# Patient Record
Sex: Female | Born: 1990
Health system: Southern US, Community
[De-identification: ages and names within clinical notes are randomized; demographics above are authoritative.]

## PROBLEM LIST (undated history)

## (undated) DIAGNOSIS — I10 Essential (primary) hypertension: Secondary | ICD-10-CM

## (undated) DIAGNOSIS — Z789 Other specified health status: Secondary | ICD-10-CM

## (undated) HISTORY — PX: ANTERIOR CRUCIATE LIGAMENT REPAIR: SHX115

---

## 2011-11-06 ENCOUNTER — Ambulatory Visit: Payer: BC Managed Care – PPO

## 2012-07-25 ENCOUNTER — Encounter (HOSPITAL_COMMUNITY): Payer: Self-pay | Admitting: Emergency Medicine

## 2012-07-25 ENCOUNTER — Emergency Department (HOSPITAL_COMMUNITY)
Admission: EM | Admit: 2012-07-25 | Discharge: 2012-07-25 | Disposition: A | Discharge status: Home / Self Care | Payer: BC Managed Care – PPO | Source: Home / Self Care

## 2012-07-25 DIAGNOSIS — W57XXXA Bitten or stung by nonvenomous insect and other nonvenomous arthropods, initial encounter: Secondary | ICD-10-CM

## 2012-07-25 DIAGNOSIS — T148 Other injury of unspecified body region: Secondary | ICD-10-CM

## 2012-07-25 MED ORDER — HYDROXYZINE HCL 25 MG PO TABS: 25.0000 mg | ORAL_TABLET | Freq: Four times a day (QID) | ORAL | Status: DC

## 2012-07-25 MED ORDER — PERMETHRIN 5 % EX CREA: TOPICAL_CREAM | CUTANEOUS | Status: DC

## 2012-07-25 NOTE — ED Notes (Signed)
Patient says she stayed over her boyfriend house and find out that he had scabies.   She has the same bumps and marks on her body so she was using his cream.  Her boyfriend provider stated he had bed bugs.

## 2012-07-25 NOTE — ED Provider Notes (Signed)
   History    CSN: [id number] Arrival date & time 07/25/12  1810  None    Chief Complaint  Patient presents with  . scabies    (Consider location/radiation/quality/duration/timing/severity/associated sxs/prior Treatment) Patient is a 22 y.o. female presenting with rash. The history is provided by the patient.  Rash Duration:  1 day Timing:  Constant Progression:  Waxing and waning Context comment:  Boyfriend with rash last week, partially treated with permithrin, recurrent rash today, pruritic.  History reviewed. No pertinent past medical history. No past surgical history on file. No family history on file. History  Substance Use Topics  . Smoking status: Not on file  . Smokeless tobacco: Not on file  . Alcohol Use: Not on file   OB History   Grav Para Term Preterm Abortions TAB SAB Ect Mult Living                 Review of Systems  Constitutional: Negative.   HENT: Negative.   Gastrointestinal: Negative.   Skin: Positive for rash.    Allergies  Asa; Ibuprofen; and Nickel  Home Medications   Current Outpatient Rx  Name  Route  Sig  Dispense  Refill  . hydrOXYzine (ATARAX/VISTARIL) 25 MG tablet   Oral   Take 1 tablet (25 mg total) by mouth every 6 (six) hours.   20 tablet   0   . permethrin (ELIMITE) 5 % cream      Use as directed on bottle, repeat in 1 week.   60 g   1    BP 134/71  Pulse 66  Temp(Src) 98.6 F (37 C) (Oral)  Resp 16  SpO2 100% Physical Exam  Nursing note and vitals reviewed. Constitutional: She is oriented to person, place, and time. She appears well-developed and well-nourished.  Neurological: She is alert and oriented to person, place, and time.  Skin: Skin is warm and dry. Rash noted.  Papules, erythematous scattered over body pruritic, c/w insect bites.    ED Course  Procedures (including critical care time) Labs Reviewed - No data to display No results found. 1. Multiple insect bites     MDM    Linna Hoff,  MD 07/25/12 1904

## 2013-03-15 ENCOUNTER — Encounter (HOSPITAL_COMMUNITY): Payer: Self-pay | Admitting: Emergency Medicine

## 2013-03-15 ENCOUNTER — Emergency Department (INDEPENDENT_AMBULATORY_CARE_PROVIDER_SITE_OTHER): Payer: Self-pay

## 2013-03-15 ENCOUNTER — Emergency Department (INDEPENDENT_AMBULATORY_CARE_PROVIDER_SITE_OTHER)
Admission: EM | Admit: 2013-03-15 | Discharge: 2013-03-15 | Disposition: A | Discharge status: Home / Self Care | Payer: Self-pay | Source: Home / Self Care | Attending: Family Medicine | Admitting: Family Medicine

## 2013-03-15 DIAGNOSIS — M542 Cervicalgia: Secondary | ICD-10-CM

## 2013-03-15 DIAGNOSIS — S20219A Contusion of unspecified front wall of thorax, initial encounter: Secondary | ICD-10-CM

## 2013-03-15 MED ORDER — PREDNISONE 10 MG PO KIT: PACK | ORAL | Status: DC

## 2013-03-15 MED ORDER — TRAMADOL HCL 50 MG PO TABS: 50.0000 mg | ORAL_TABLET | Freq: Four times a day (QID) | ORAL | Status: DC | PRN

## 2013-03-15 MED ORDER — CYCLOBENZAPRINE HCL 5 MG PO TABS: 5.0000 mg | ORAL_TABLET | Freq: Every evening | ORAL | Status: DC | PRN

## 2013-03-15 NOTE — Discharge Instructions (Signed)
Thank you for coming in today. Physical therapy should contact you in a few days.  Take medications as directed and as needed If not getting better followup with Dr. Tamala Julian at [facility] or go to the emergency room if you notice new weakness new numbness problems walking or bowel or bladder problems.  Cervical Sprain A cervical sprain is an injury in the neck in which the strong, fibrous tissues (ligaments) that connect your neck bones stretch or tear. Cervical sprains can range from mild to severe. Severe cervical sprains can cause the neck vertebrae to be unstable. This can lead to damage of the spinal cord and can result in serious nervous system problems. The amount of time it takes for a cervical sprain to get better depends on the cause and extent of the injury. Most cervical sprains heal in 1 to 3 weeks. CAUSES  Severe cervical sprains may be caused by:   Contact sport injuries (such as from football, rugby, wrestling, hockey, auto racing, gymnastics, diving, martial arts, or boxing).   Motor vehicle collisions.   Whiplash injuries. This is an injury from a sudden forward-and backward whipping movement of the head and neck.  Falls.  Mild cervical sprains may be caused by:   Being in an awkward position, such as while cradling a telephone between your ear and shoulder.   Sitting in a chair that does not offer proper support.   Working at a poorly Landscape architect station.   Looking up or down for long periods of time.  SYMPTOMS   Pain, soreness, stiffness, or a burning sensation in the front, back, or sides of the neck. This discomfort may develop immediately after the injury or slowly, 24 hours or more after the injury.   Pain or tenderness directly in the middle of the back of the neck.   Shoulder or upper back pain.   Limited ability to move the neck.   Headache.   Dizziness.   Weakness, numbness, or tingling in the hands or arms.    Muscle spasms.   Difficulty swallowing or chewing.   Tenderness and swelling of the neck.  DIAGNOSIS  Most of the time your health care provider can diagnose a cervical sprain by taking your history and doing a physical exam. Your health care provider will ask about previous neck injuries and any known neck problems, such as arthritis in the neck. X-rays may be taken to find out if there are any other problems, such as with the bones of the neck. Other tests, such as a CT scan or MRI, may also be needed.  TREATMENT  Treatment depends on the severity of the cervical sprain. Mild sprains can be treated with rest, keeping the neck in place (immobilization), and pain medicines. Severe cervical sprains are immediately immobilized. Further treatment is done to help with pain, muscle spasms, and other symptoms and may include:  Medicines, such as pain relievers, numbing medicines, or muscle relaxants.   Physical therapy. This may involve stretching exercises, strengthening exercises, and posture training. Exercises and improved posture can help stabilize the neck, strengthen muscles, and help stop symptoms from returning.  HOME CARE INSTRUCTIONS   Put ice on the injured area.   Put ice in a plastic bag.   Place a towel between your skin and the bag.   Leave the ice on for 15 20 minutes, 3 4 times a day.   If your injury was severe, you may have been given a cervical  collar to wear. A cervical collar is a two-piece collar designed to keep your neck from moving while it heals.  Do not remove the collar unless instructed by your health care provider.  If you have long hair, keep it outside of the collar.  Ask your health care provider before making any adjustments to your collar. Minor adjustments may be required over time to improve comfort and reduce pressure on your chin or on the back of your head.  Ifyou are allowed to remove the collar for cleaning or bathing, follow your  health care provider's instructions on how to do so safely.  Keep your collar clean by wiping it with mild soap and water and drying it completely. If the collar you have been given includes removable pads, remove them every 1 2 days and hand wash them with soap and water. Allow them to air dry. They should be completely dry before you wear them in the collar.  If you are allowed to remove the collar for cleaning and bathing, wash and dry the skin of your neck. Check your skin for irritation or sores. If you see any, tell your health care provider.  Do not drive while wearing the collar.   Only take over-the-counter or prescription medicines for pain, discomfort, or fever as directed by your health care provider.   Keep all follow-up appointments as directed by your health care provider.   Keep all physical therapy appointments as directed by your health care provider.   Make any needed adjustments to your workstation to promote good posture.   Avoid positions and activities that make your symptoms worse.   Warm up and stretch before being active to help prevent problems.  SEEK MEDICAL CARE IF:   Your pain is not controlled with medicine.   You are unable to decrease your pain medicine over time as planned.   Your activity level is not improving as expected.  SEEK IMMEDIATE MEDICAL CARE IF:   You develop any bleeding.  You develop stomach upset.  You have signs of an allergic reaction to your medicine.   Your symptoms get worse.   You develop new, unexplained symptoms.   You have numbness, tingling, weakness, or paralysis in any part of your body.  MAKE SURE YOU:   Understand these instructions.  Will watch your condition.  Will get help right away if you are not doing well or get worse. Document Released: 11/02/2006 Document Revised: 10/26/2012 Document Reviewed: 07/13/2012 [facility] Patient Information 2014 [place].  Blunt Chest Trauma Blunt  chest trauma is an injury caused by a blow to the chest. These chest injuries can be very painful. Blunt chest trauma often results in bruised or broken (fractured) ribs. Most cases of bruised and fractured ribs from blunt chest traumas get better after 1 to 3 weeks of rest and pain medicine. Often, the soft tissue in the chest wall is also injured, causing pain and bruising. Internal organs, such as the heart and lungs, may also be injured. Blunt chest trauma can lead to serious medical problems. This injury requires immediate medical care. CAUSES   Motor vehicle collisions.  Falls.  Physical violence.  Sports injuries. SYMPTOMS   Chest pain. The pain may be worse when you move or breathe deeply.  Shortness of breath.  Lightheadedness.  Bruising.  Tenderness.  Swelling. DIAGNOSIS  Your caregiver will do a physical exam. X-rays may be taken to look for fractures. However, minor rib fractures may not show up on  X-rays until a few days after the injury. If a more serious injury is suspected, further imaging tests may be done. This may include ultrasounds, computed tomography (CT) scans, or magnetic resonance imaging (MRI). TREATMENT  Treatment depends on the severity of your injury. Your caregiver may prescribe pain medicines and deep breathing exercises. HOME CARE INSTRUCTIONS  Limit your activities until you can move around without much pain.  Do not do any strenuous work until your injury is healed.  Put ice on the injured area.  Put ice in a plastic bag.  Place a towel between your skin and the bag.  Leave the ice on for 15-20 minutes, 03-04 times a day.  You may wear a rib belt as directed by your caregiver to reduce pain.  Practice deep breathing as directed by your caregiver to keep your lungs clear.  Only take over-the-counter or prescription medicines for pain, fever, or discomfort as directed by your caregiver. SEEK IMMEDIATE MEDICAL CARE IF:   You have  increasing pain or shortness of breath.  You cough up blood.  You have nausea, vomiting, or abdominal pain.  You have a fever.  You feel dizzy, weak, or you faint. MAKE SURE YOU:  Understand these instructions.  Will watch your condition.  Will get help right away if you are not doing well or get worse. Document Released: 02/13/2004 Document Revised: 03/30/2011 Document Reviewed: 10/22/2010 [facility] Patient Information 2014 [place].
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
## 2016-03-05 DIAGNOSIS — R110 Nausea: Secondary | ICD-10-CM | POA: Diagnosis not present

## 2016-03-05 DIAGNOSIS — M545 Low back pain: Secondary | ICD-10-CM | POA: Diagnosis not present

## 2016-05-15 DIAGNOSIS — N62 Hypertrophy of breast: Secondary | ICD-10-CM | POA: Diagnosis not present

## 2016-05-18 ENCOUNTER — Other Ambulatory Visit: Payer: Self-pay | Admitting: Family Medicine

## 2016-05-18 DIAGNOSIS — R109 Unspecified abdominal pain: Secondary | ICD-10-CM

## 2016-05-26 ENCOUNTER — Ambulatory Visit
Admission: RE | Admit: 2016-05-26 | Discharge: 2016-05-26 | Disposition: A | Discharge status: Home / Self Care | Payer: 59 | Source: Ambulatory Visit | Attending: Family Medicine | Admitting: Family Medicine

## 2016-05-26 DIAGNOSIS — R109 Unspecified abdominal pain: Secondary | ICD-10-CM | POA: Diagnosis not present

## 2016-06-08 DIAGNOSIS — N62 Hypertrophy of breast: Secondary | ICD-10-CM | POA: Diagnosis not present

## 2016-06-30 DIAGNOSIS — N760 Acute vaginitis: Secondary | ICD-10-CM | POA: Diagnosis not present

## 2017-06-11 DIAGNOSIS — M25561 Pain in right knee: Secondary | ICD-10-CM | POA: Diagnosis not present

## 2017-06-11 DIAGNOSIS — G8929 Other chronic pain: Secondary | ICD-10-CM | POA: Diagnosis not present

## 2017-06-16 DIAGNOSIS — S83511D Sprain of anterior cruciate ligament of right knee, subsequent encounter: Secondary | ICD-10-CM | POA: Diagnosis not present

## 2017-06-16 DIAGNOSIS — M25561 Pain in right knee: Secondary | ICD-10-CM | POA: Diagnosis not present

## 2017-06-16 DIAGNOSIS — M6281 Muscle weakness (generalized): Secondary | ICD-10-CM | POA: Diagnosis not present

## 2017-06-25 DIAGNOSIS — M6281 Muscle weakness (generalized): Secondary | ICD-10-CM | POA: Diagnosis not present

## 2017-06-25 DIAGNOSIS — M25561 Pain in right knee: Secondary | ICD-10-CM | POA: Diagnosis not present

## 2017-06-25 DIAGNOSIS — S83511D Sprain of anterior cruciate ligament of right knee, subsequent encounter: Secondary | ICD-10-CM | POA: Diagnosis not present

## 2017-06-28 DIAGNOSIS — S83511D Sprain of anterior cruciate ligament of right knee, subsequent encounter: Secondary | ICD-10-CM | POA: Diagnosis not present

## 2017-06-28 DIAGNOSIS — M6281 Muscle weakness (generalized): Secondary | ICD-10-CM | POA: Diagnosis not present

## 2017-06-28 DIAGNOSIS — M25561 Pain in right knee: Secondary | ICD-10-CM | POA: Diagnosis not present

## 2018-01-30 IMAGING — US US ABDOMEN COMPLETE
1 series · 14 of 25 positions shown · non-contrast
Comparison: None.

CLINICAL DATA: Diffuse abdominal pain for the past 5 months.

EXAM:
ABDOMEN ULTRASOUND COMPLETE

[Series 1: us abdomen complete · 0.17mm/px · 14 of 84 slices shown]
[im 1/84]
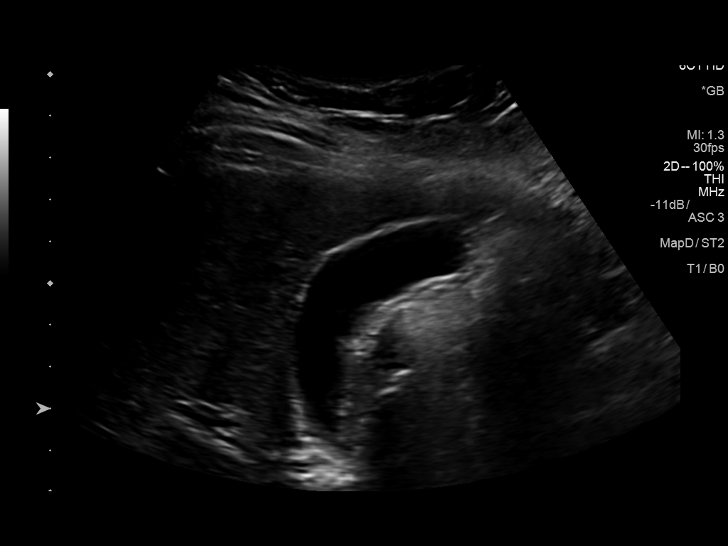
[im 7/84]
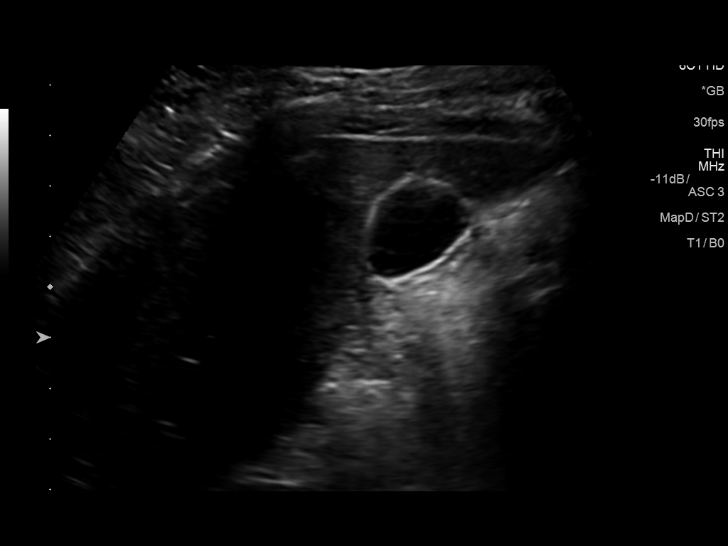
[im 14/84]
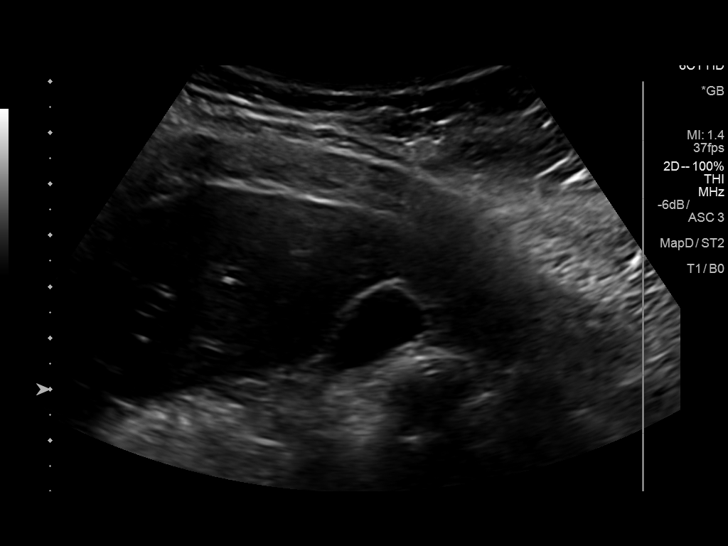
[im 21/84]
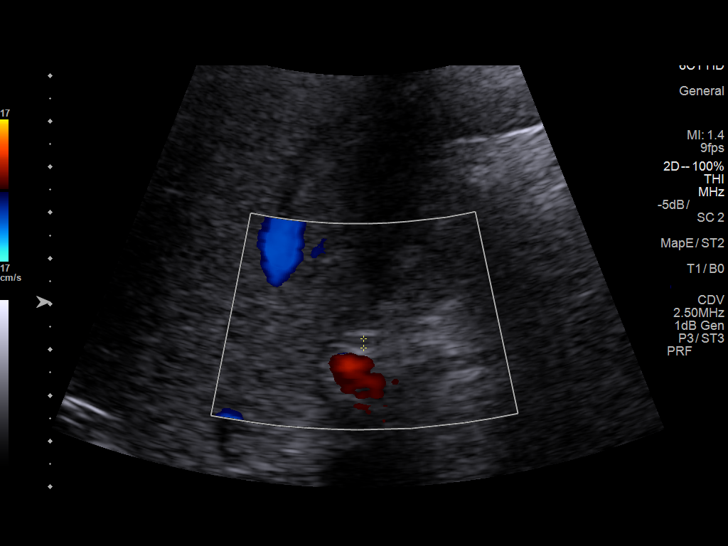
[im 28/84]
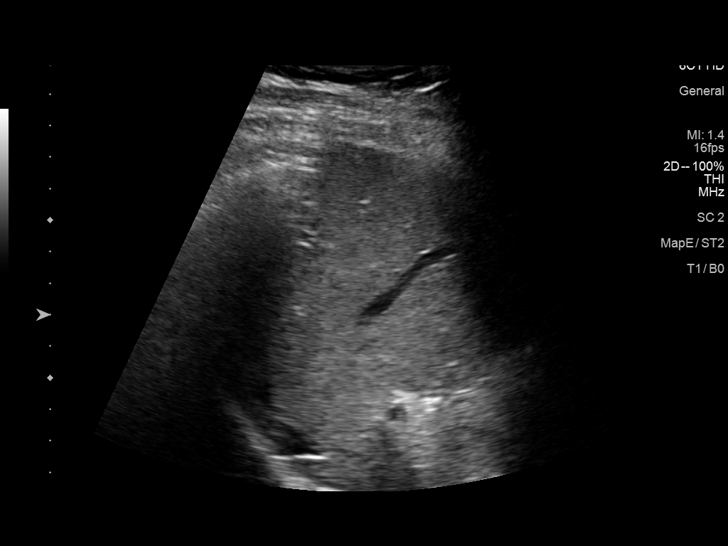
[im 32/84]
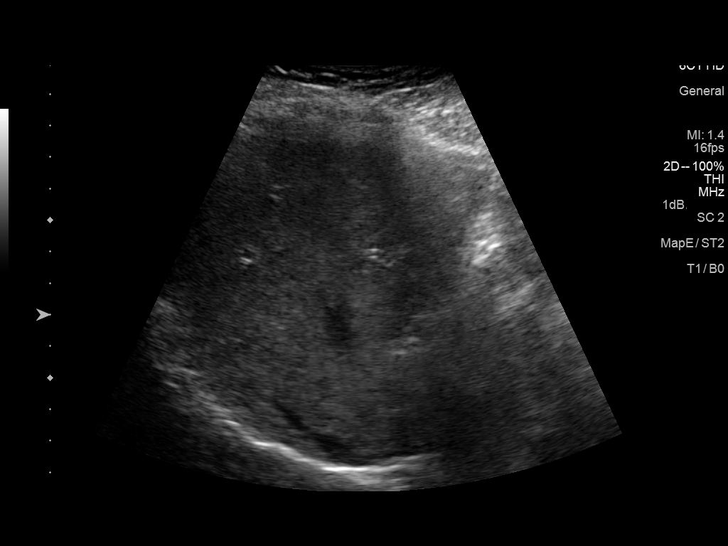
[im 39/84]
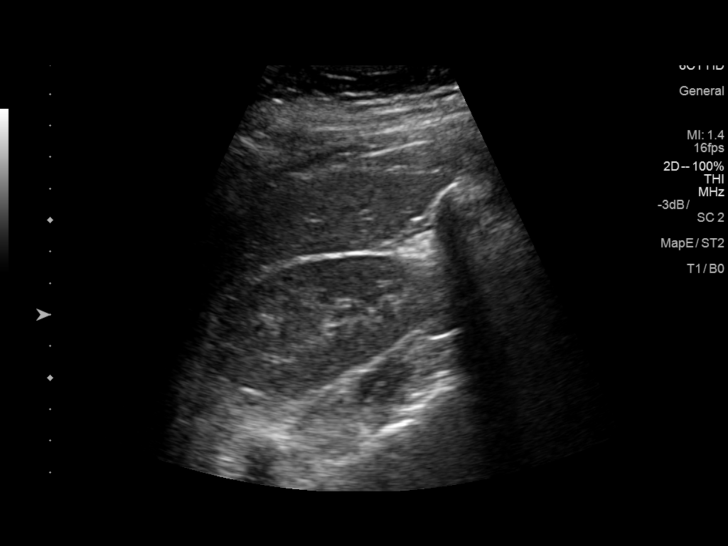
[im 45/84]
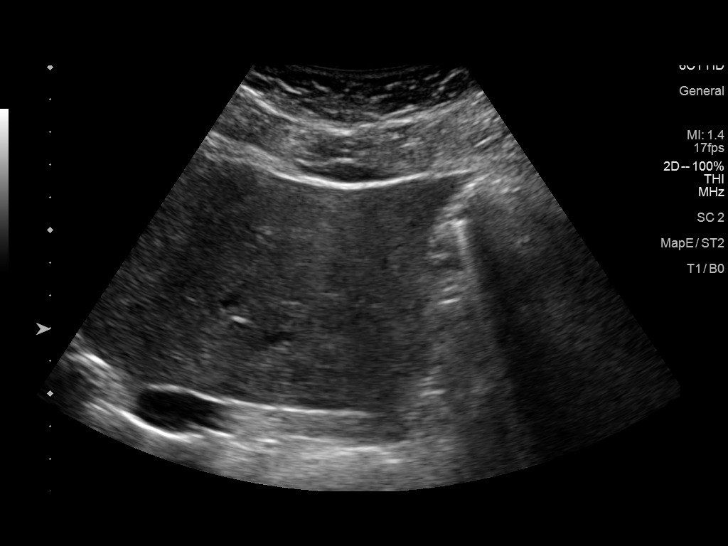
[im 52/84]
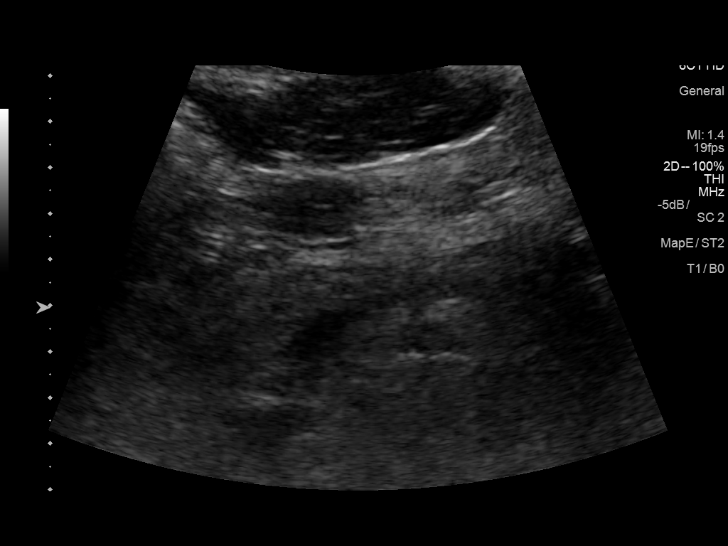
[im 56/84]
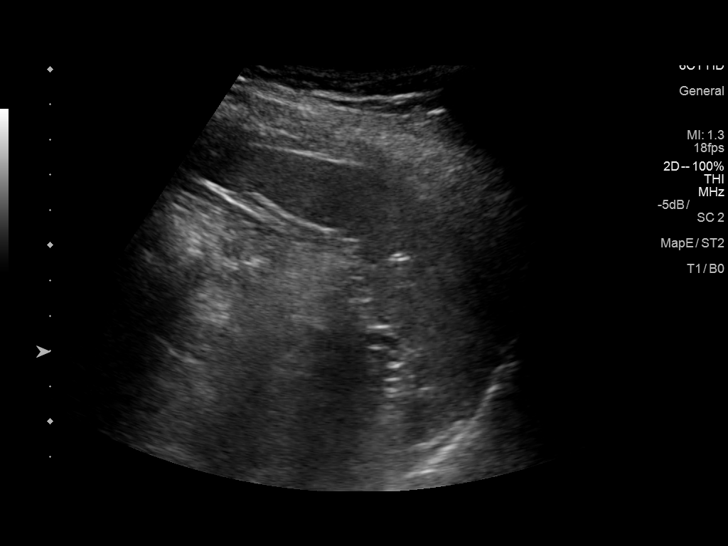
[im 63/84]
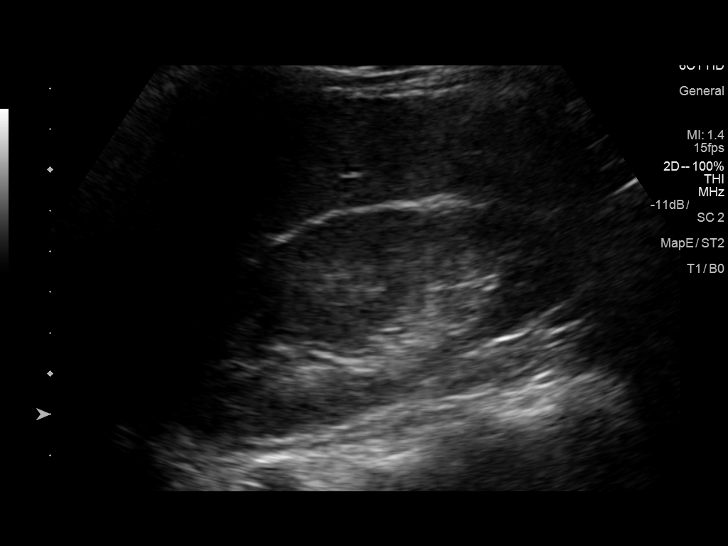
[im 70/84]
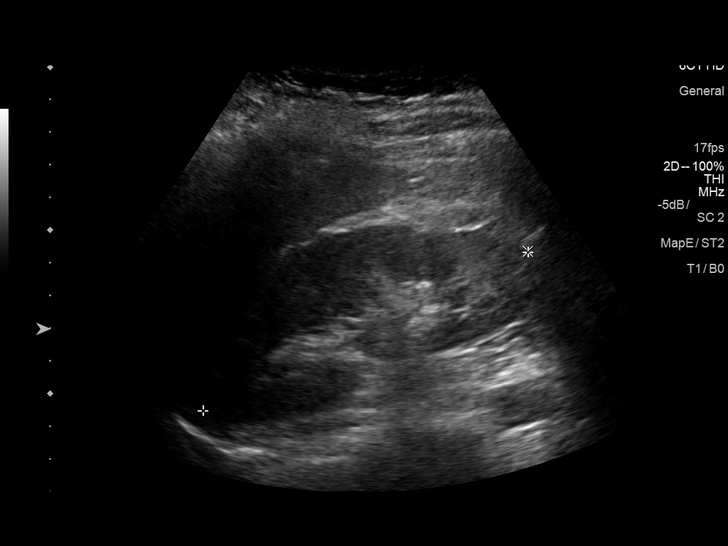
[im 77/84]
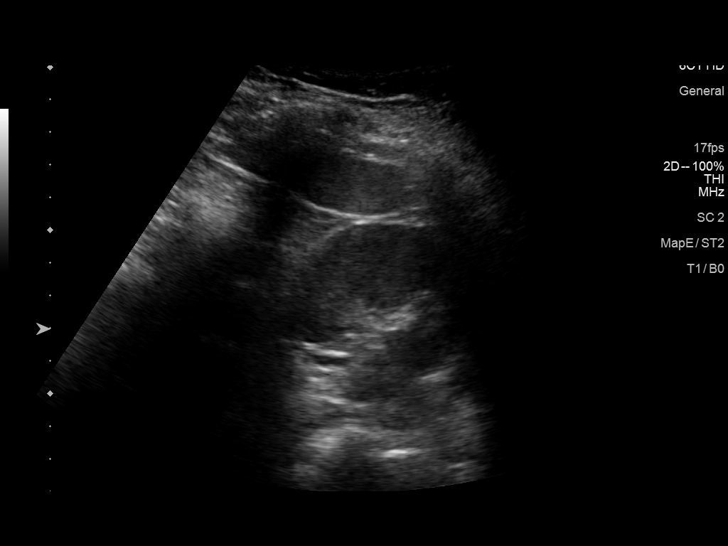
[im 84/84]
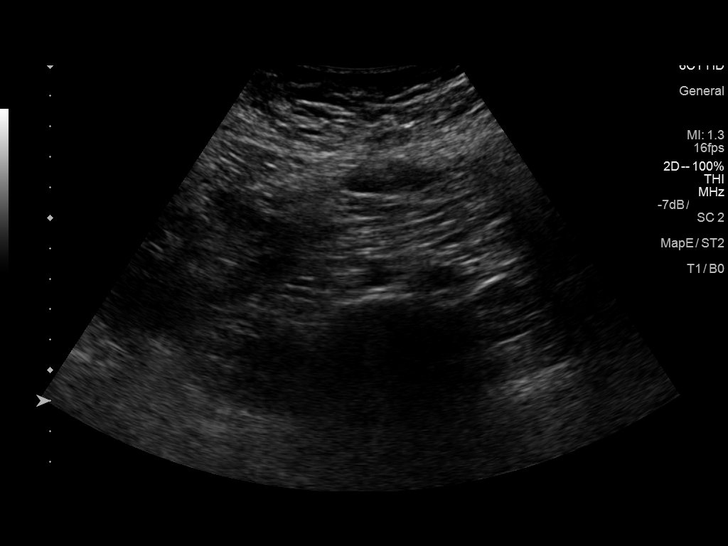

[14 of 25 positions shown; findings below may reference images not displayed]

FINDINGS: Gallbladder: No gallstones or wall thickening visualized. No
sonographic Murphy sign noted by sonographer.

Common bile duct: Diameter: 2.0 mm

Liver: No focal lesion identified. Within normal limits in
parenchymal echogenicity.

IVC: No abnormality visualized.

Pancreas: Visualized portion unremarkable.

Spleen: Size and appearance within normal limits.

Right Kidney: Length: 11.5 cm. Echogenicity within normal limits. No
mass or hydronephrosis visualized.

Left Kidney: Length: 10.9 cm. Echogenicity within normal limits. No
mass or hydronephrosis visualized.

Abdominal aorta: No aneurysm visualized.

Other findings: None.
IMPRESSION: Normal examination.

## 2018-08-29 ENCOUNTER — Encounter (HOSPITAL_COMMUNITY): Payer: Self-pay

## 2018-08-29 ENCOUNTER — Emergency Department (HOSPITAL_COMMUNITY)
Admission: EM | Admit: 2018-08-29 | Discharge: 2018-08-29 | Discharge status: Against Medical Advice | Payer: 59 | Attending: Emergency Medicine | Admitting: Emergency Medicine

## 2018-08-29 DIAGNOSIS — R509 Fever, unspecified: Secondary | ICD-10-CM | POA: Diagnosis present

## 2018-08-29 DIAGNOSIS — R5383 Other fatigue: Secondary | ICD-10-CM | POA: Insufficient documentation

## 2018-08-29 DIAGNOSIS — K5900 Constipation, unspecified: Secondary | ICD-10-CM | POA: Diagnosis not present

## 2018-08-29 DIAGNOSIS — Z5321 Procedure and treatment not carried out due to patient leaving prior to being seen by health care provider: Secondary | ICD-10-CM | POA: Diagnosis not present

## 2018-08-29 NOTE — ED Notes (Signed)
Pt stated she was leaving and seen walking towards the parking lot

## 2018-08-29 NOTE — ED Triage Notes (Signed)
Pt states that she has been having a fever since Friday with chills, fatigue and constipation and wants to be tested for covid

## 2020-01-20 NOTE — L&D Delivery Note (Signed)
Delivery Note [facility] physician pt    Patient Name: Kathryn Shepherd [facility] DOB: September 14, 1990 MRN: [id number]  Date of admission: 09/13/2020 Delivering MD: Noralyn Pick  Date of delivery: 09/14/20 Type of delivery: SVD  Newborn Data: Live born female  Birth Weight:   APGAR: 34, 11   Newborn Delivery   Birth date/time: 09/14/2020 18:49:00 Delivery type: Vaginal, Spontaneous      Yanina Trenise Shepherd, 30 y.o., @ [redacted]w[redacted]d  G1P0, who was admitted for G1P0, with an IUP @ 37w3dpresented for PROM at 2300 on 8/25. Received care with Dr CoLandry Mellowith [name]. Obesity BMI 41. Fibroids. New onset GHTN, PCR 0.08, other labs unremarkable, asymptomatic.. Marland Kitchent was PPROM about 40 hours, no abx indicated, pt afebrile, no tachycardia on fetal or maternal side.  I was called to the room when she progressed 2+ station in the second stage of labor.  She pushed for 15/min.  Turtle sing was noted with delivery of fetal, head, tight hymnal ring noted as well. Fetal head was delivered was unable to deliver the shoulder with gentle traction, I called shoulder dystocia, pt was placed down, and McRoberts with suprapubic was administered, I was able to deliver the posterior shoulder with ease after 10 seconds, no residual effect, full and equal strength with both arms, strong grip, discussed risk of shoulder dystocia with parents, denies questions. She delivered a viable infant, cephalic and restituted to the LOA position over an intact perineum.  A nuchal cord   was not identified. The baby was placed on maternal abdomen while initial step of NRP were perfmored (Dry, Stimulated, and warmed). Hat placed on baby for thermoregulation. Delayed cord clamping was performed for 2 minutes.  Cord double clamped and cut.  Cord cut by father. Apgar scores were 7 and 9. Prophylactic Pitocin was started in the third stage of labor for active management. The placenta delivered spontaneously, shultz, with a 3 vessel cord and was sent to LD.  Inspection  revealed none. An examination of the vaginal vault and cervix was free from lacerations. The uterus was firm, bleeding stable.   Placenta and umbilical artery blood gas were not sent.  There were no complications during the procedure.  Mom and baby skin to skin following delivery. Left in stable condition. BP (!) 133/92   Pulse 78   Temp (!) 97.4 F (36.3 C) (Oral)   Resp 17   Ht '5\' 8"'$  (1.727 m)   Wt 123.4 kg   SpO2 100%   BMI 41.36 kg/m  Pt denies HA, RUQ pain or vision changes. Plan to repeat CBC and CMP in the morning.   Maternal Info: Anesthesia: Epidural Episiotomy: no Lacerations:  no Suture Repair: no Est. Blood Loss (mL):  3535m Newborn Info:  Baby Sex: female Circumcision: in pt circ desired Babies Name: Kathryn Shepherd APGAR (1 MIN):  7 APGAR (5 MINS):  9 APGAR (10 MINS):     Mom to postpartum.  Baby to Couplet care / Skin to Skin.  DR Dillard updated  JadWashingtonNMLa PalmaP-C 09/14/20 7:45 PM

## 2020-09-13 ENCOUNTER — Encounter (HOSPITAL_COMMUNITY): Payer: Self-pay | Admitting: Obstetrics and Gynecology

## 2020-09-13 ENCOUNTER — Other Ambulatory Visit: Payer: Self-pay

## 2020-09-13 ENCOUNTER — Inpatient Hospital Stay (HOSPITAL_COMMUNITY)
Admission: AD | Admit: 2020-09-13 | Discharge: 2020-09-16 | DRG: 806 | Disposition: A | Discharge status: Home / Self Care | Payer: 59 | Attending: Obstetrics and Gynecology | Admitting: Obstetrics and Gynecology

## 2020-09-13 ENCOUNTER — Inpatient Hospital Stay (HOSPITAL_COMMUNITY): Payer: 59 | Admitting: Anesthesiology

## 2020-09-13 DIAGNOSIS — O3413 Maternal care for benign tumor of corpus uteri, third trimester: Secondary | ICD-10-CM | POA: Diagnosis present

## 2020-09-13 DIAGNOSIS — Z3A37 37 weeks gestation of pregnancy: Secondary | ICD-10-CM | POA: Diagnosis not present

## 2020-09-13 DIAGNOSIS — O99214 Obesity complicating childbirth: Secondary | ICD-10-CM | POA: Diagnosis present

## 2020-09-13 DIAGNOSIS — Z20822 Contact with and (suspected) exposure to covid-19: Secondary | ICD-10-CM | POA: Diagnosis present

## 2020-09-13 DIAGNOSIS — O134 Gestational [pregnancy-induced] hypertension without significant proteinuria, complicating childbirth: Secondary | ICD-10-CM | POA: Diagnosis present

## 2020-09-13 DIAGNOSIS — O4292 Full-term premature rupture of membranes, unspecified as to length of time between rupture and onset of labor: Secondary | ICD-10-CM | POA: Diagnosis present

## 2020-09-13 DIAGNOSIS — O26893 Other specified pregnancy related conditions, third trimester: Secondary | ICD-10-CM | POA: Diagnosis present

## 2020-09-13 DIAGNOSIS — O139 Gestational [pregnancy-induced] hypertension without significant proteinuria, unspecified trimester: Secondary | ICD-10-CM | POA: Diagnosis present

## 2020-09-13 DIAGNOSIS — D62 Acute posthemorrhagic anemia: Secondary | ICD-10-CM | POA: Diagnosis not present

## 2020-09-13 DIAGNOSIS — O9081 Anemia of the puerperium: Secondary | ICD-10-CM | POA: Diagnosis not present

## 2020-09-13 DIAGNOSIS — D259 Leiomyoma of uterus, unspecified: Secondary | ICD-10-CM | POA: Diagnosis present

## 2020-09-13 DIAGNOSIS — O4290 Premature rupture of membranes, unspecified as to length of time between rupture and onset of labor, unspecified weeks of gestation: Secondary | ICD-10-CM | POA: Diagnosis present

## 2020-09-13 HISTORY — DX: Other specified health status: Z78.9

## 2020-09-13 LAB — CBC
HCT: 34.4 % — ABNORMAL LOW (ref 36.0–46.0)
HCT: 34.3 % — ABNORMAL LOW (ref 36.0–46.0)
Hemoglobin: 11.2 g/dL — ABNORMAL LOW (ref 12.0–15.0)
Hemoglobin: 11.2 g/dL — ABNORMAL LOW (ref 12.0–15.0)
MCH: 26.2 pg (ref 26.0–34.0)
MCH: 26.1 pg (ref 26.0–34.0)
MCHC: 32.6 g/dL (ref 30.0–36.0)
MCHC: 32.7 g/dL (ref 30.0–36.0)
MCV: 80.6 fL (ref 80.0–100.0)
MCV: 80 fL (ref 80.0–100.0)
Platelets: 277 10*3/uL (ref 150–400)
Platelets: 254 10*3/uL (ref 150–400)
RBC: 4.27 MIL/uL (ref 3.87–5.11)
RBC: 4.29 MIL/uL (ref 3.87–5.11)
RDW: 13.1 % (ref 11.5–15.5)
RDW: 13 % (ref 11.5–15.5)
WBC: 10 10*3/uL (ref 4.0–10.5)
WBC: 10.2 10*3/uL (ref 4.0–10.5)
nRBC: 0 % (ref 0.0–0.2)
nRBC: 0 % (ref 0.0–0.2)

## 2020-09-13 LAB — RESP PANEL BY RT-PCR (FLU A&B, COVID) ARPGX2
Influenza A by PCR: NEGATIVE
Influenza B by PCR: NEGATIVE
SARS Coronavirus 2 by RT PCR: NEGATIVE

## 2020-09-13 LAB — PROTEIN / CREATININE RATIO, URINE
Creatinine, Urine: 235.4 mg/dL
Protein Creatinine Ratio: 0.08 mg/mg{Cre} (ref 0.00–0.15)
Total Protein, Urine: 20 mg/dL

## 2020-09-13 LAB — COMPREHENSIVE METABOLIC PANEL
ALT: 15 U/L (ref 0–44)
AST: 16 U/L (ref 15–41)
Albumin: 3 g/dL — ABNORMAL LOW (ref 3.5–5.0)
Alkaline Phosphatase: 122 U/L (ref 38–126)
Anion gap: 9 (ref 5–15)
BUN: 9 mg/dL (ref 6–20)
CO2: 20 mmol/L — ABNORMAL LOW (ref 22–32)
Calcium: 9.7 mg/dL (ref 8.9–10.3)
Chloride: 109 mmol/L (ref 98–111)
Creatinine, Ser: 0.7 mg/dL (ref 0.44–1.00)
GFR, Estimated: >60 mL/min (ref >60)
Glucose, Bld: 108 mg/dL — ABNORMAL HIGH (ref 70–99)
Potassium: 3.6 mmol/L (ref 3.5–5.1)
Sodium: 138 mmol/L (ref 135–145)
Total Bilirubin: 0.5 mg/dL (ref 0.3–1.2)
Total Protein: 6.2 g/dL — ABNORMAL LOW (ref 6.5–8.1)

## 2020-09-13 LAB — TYPE AND SCREEN
ABO/RH(D): O POS
Antibody Screen: NEGATIVE

## 2020-09-13 LAB — RPR: RPR Ser Ql: NONREACTIVE

## 2020-09-13 LAB — POCT FERN TEST: POCT Fern Test: POSITIVE

## 2020-09-13 MED ORDER — FENTANYL-BUPIVACAINE-NACL 0.5-0.125-0.9 MG/250ML-% EP SOLN
12.0000 mL/h | EPIDURAL | Status: DC | PRN
  Administered 2020-09-13: 12 mL/h via EPIDURAL
  Filled 2020-09-13 (×2): qty 250

## 2020-09-13 MED ORDER — DIPHENHYDRAMINE HCL 50 MG/ML IJ SOLN: 12.5000 mg | INTRAMUSCULAR | Status: DC | PRN

## 2020-09-13 MED ORDER — OXYTOCIN-SODIUM CHLORIDE 30-0.9 UT/500ML-% IV SOLN
1.0000 m[IU]/min | INTRAVENOUS | Status: DC
  Administered 2020-09-13: 2 m[IU]/min via INTRAVENOUS
  Filled 2020-09-13 (×2): qty 500

## 2020-09-13 MED ORDER — OXYCODONE-ACETAMINOPHEN 5-325 MG PO TABS: 2.0000 | ORAL_TABLET | ORAL | Status: DC | PRN

## 2020-09-13 MED ORDER — LIDOCAINE HCL (PF) 1 % IJ SOLN: 30.0000 mL | INTRAMUSCULAR | Status: DC | PRN

## 2020-09-13 MED ORDER — FLEET ENEMA 7-19 GM/118ML RE ENEM: 1.0000 | ENEMA | RECTAL | Status: DC | PRN

## 2020-09-13 MED ORDER — LABETALOL HCL 5 MG/ML IV SOLN
20.0000 mg | INTRAVENOUS | Status: DC | PRN
  Administered 2020-09-13: 20 mg via INTRAVENOUS
  Filled 2020-09-13: qty 4

## 2020-09-13 MED ORDER — LABETALOL HCL 5 MG/ML IV SOLN: 80.0000 mg | INTRAVENOUS | Status: DC | PRN

## 2020-09-13 MED ORDER — PHENYLEPHRINE 40 MCG/ML (10ML) SYRINGE FOR IV PUSH (FOR BLOOD PRESSURE SUPPORT): 80.0000 ug | PREFILLED_SYRINGE | INTRAVENOUS | Status: DC | PRN

## 2020-09-13 MED ORDER — LIDOCAINE HCL (PF) 1 % IJ SOLN
INTRAMUSCULAR | Status: DC | PRN
  Administered 2020-09-13: 11 mL via EPIDURAL

## 2020-09-13 MED ORDER — LACTATED RINGERS IV SOLN
500.0000 mL | Freq: Once | INTRAVENOUS | Status: AC
  Administered 2020-09-13: 500 mL via INTRAVENOUS

## 2020-09-13 MED ORDER — OXYCODONE-ACETAMINOPHEN 5-325 MG PO TABS: 1.0000 | ORAL_TABLET | ORAL | Status: DC | PRN

## 2020-09-13 MED ORDER — FENTANYL CITRATE (PF) 100 MCG/2ML IJ SOLN
50.0000 ug | INTRAMUSCULAR | Status: DC | PRN
  Administered 2020-09-13 (×2): 100 ug via INTRAVENOUS
  Filled 2020-09-13 (×2): qty 2

## 2020-09-13 MED ORDER — ONDANSETRON HCL 4 MG/2ML IJ SOLN: 4.0000 mg | Freq: Four times a day (QID) | INTRAMUSCULAR | Status: DC | PRN

## 2020-09-13 MED ORDER — LACTATED RINGERS IV SOLN: INTRAVENOUS | Status: DC

## 2020-09-13 MED ORDER — EPHEDRINE 5 MG/ML INJ: 10.0000 mg | INTRAVENOUS | Status: DC | PRN

## 2020-09-13 MED ORDER — SOD CITRATE-CITRIC ACID 500-334 MG/5ML PO SOLN
30.0000 mL | ORAL | Status: DC | PRN
Start: 2020-09-13 — End: 2020-09-14

## 2020-09-13 MED ORDER — LACTATED RINGERS IV SOLN: 500.0000 mL | INTRAVENOUS | Status: DC | PRN

## 2020-09-13 MED ORDER — LABETALOL HCL 5 MG/ML IV SOLN: 40.0000 mg | INTRAVENOUS | Status: DC | PRN

## 2020-09-13 MED ORDER — MISOPROSTOL 50MCG HALF TABLET
50.0000 ug | ORAL_TABLET | Freq: Once | ORAL | Status: AC
  Administered 2020-09-13: 50 ug via BUCCAL
  Filled 2020-09-13: qty 1

## 2020-09-13 MED ORDER — OXYTOCIN BOLUS FROM INFUSION: 333.0000 mL | Freq: Once | INTRAVENOUS | Status: DC

## 2020-09-13 MED ORDER — HYDRALAZINE HCL 20 MG/ML IJ SOLN: 10.0000 mg | INTRAMUSCULAR | Status: DC | PRN

## 2020-09-13 MED ORDER — TERBUTALINE SULFATE 1 MG/ML IJ SOLN: 0.2500 mg | Freq: Once | INTRAMUSCULAR | Status: DC | PRN

## 2020-09-13 MED ORDER — SOD CITRATE-CITRIC ACID 500-334 MG/5ML PO SOLN: 30.0000 mL | ORAL | Status: DC | PRN

## 2020-09-13 MED ORDER — OXYTOCIN-SODIUM CHLORIDE 30-0.9 UT/500ML-% IV SOLN: 2.5000 [IU]/h | INTRAVENOUS | Status: DC

## 2020-09-13 MED ORDER — OXYTOCIN BOLUS FROM INFUSION
333.0000 mL | Freq: Once | INTRAVENOUS | Status: AC
  Administered 2020-09-14: 333 mL via INTRAVENOUS

## 2020-09-13 MED ORDER — ACETAMINOPHEN 325 MG PO TABS: 650.0000 mg | ORAL_TABLET | ORAL | Status: DC | PRN

## 2020-09-13 MED ORDER — OXYTOCIN-SODIUM CHLORIDE 30-0.9 UT/500ML-% IV SOLN
2.5000 [IU]/h | INTRAVENOUS | Status: DC
  Administered 2020-09-14: 2.5 [IU]/h via INTRAVENOUS

## 2020-09-13 NOTE — Plan of Care (Signed)
  Problem: Education: Goal: Knowledge of Childbirth will improve Outcome: Progressing Goal: Ability to make informed decisions regarding treatment and plan of care will improve Outcome: Progressing Goal: Ability to state and carry out methods to decrease the pain will improve Outcome: Progressing   Problem: Coping: Goal: Ability to verbalize concerns and feelings about labor and delivery will improve Outcome: Progressing

## 2020-09-13 NOTE — H&P (Signed)
OB ADMISSION/ HISTORY & PHYSICAL:  Admission Date: 09/13/2020 12:53 AM  Admit Diagnosis: Normal labor  Kathryn Shepherd is a 30 y.o. female G1P0 92w3dpresenting for leakage of fluid. Endorses active FM, denies vaginal bleeding. SROM confirmed and pt to be admitted.   History of current pregnancy: G1P0   Primary OB Provider: Dr. CLandry MellowPatient entered care with [facility] at 9+6 wks.   EDC 913/22 by 1st trimester [place]  Antenatal testing: for maternal obesity and fibroids started at 28 weeks  Significant prenatal events:  Patient Active Problem List   Diagnosis Date Noted   Indication for care in labor or delivery 09/13/2020    Prenatal Labs: ABO, Rh: --/--/O POS (08/26 04098 Antibody: NEG (08/26 0218) Rubella:   immune RPR:   NR HBsAg:   NR HIV:   NR GTT: passed 1 hr GBS:   neg GC/CHL: neg/neg Genetics: declined Tdap/influenza vaccines: tdap this preg   OB History  Gravida Para Term Preterm AB Living  1            SAB IAB Ectopic Multiple Live Births               # Outcome Date GA Lbr Len/2nd Weight Sex Delivery Anes PTL Lv  1 Current             Medical / Surgical History: Past medical history:  Past Medical History:  Diagnosis Date   Medical history non-contributory     Past surgical history:  Past Surgical History:  Procedure Laterality Date   ANTERIOR CRUCIATE LIGAMENT REPAIR     Family History: History reviewed. No pertinent family history.  Social History:  reports that she has never smoked. She has never used smokeless tobacco. She reports that she does not drink alcohol and does not use drugs.  Allergies: Asa [aspirin], Ibuprofen, Nickel, and Latex   Current Medications at time of admission:  Prior to Admission medications   Medication Sig Start Date End Date Taking? Authorizing Provider  Prenatal Vit-Fe Fumarate-FA (MULTIVITAMIN-PRENATAL) 27-0.8 MG TABS tablet Take 1 tablet by mouth daily at 12 noon.   Yes [provider]   cyclobenzaprine (FLEXERIL) 5 MG tablet Take 1 tablet (5 mg total) by mouth at bedtime as needed for muscle spasms. 03/15/13   CGregor Hams MD  PredniSONE 10 MG KIT 12 day dose pack po 03/15/13   CGregor Hams MD  traMADol (ULTRAM) 50 MG tablet Take 1 tablet (50 mg total) by mouth every 6 (six) hours as needed. 03/15/13   CGregor Hams MD    Review of Systems: Constitutional: Negative   HENT: Negative   Eyes: Negative   Respiratory: Negative   Cardiovascular: Negative   Gastrointestinal: Negative  Genitourinary: neg for bloody show, neg for LOF   Musculoskeletal: Negative   Skin: Negative   Neurological: Negative   Endo/Heme/Allergies: Negative   Psychiatric/Behavioral: Negative    Physical Exam: VS: Blood pressure (!) 156/94, pulse 79, temperature 97.9 F (36.6 C), resp. rate 18, height 5' 8"  (1.727 m), weight 123.4 kg, SpO2 98 %. AAO x3, no signs of distress Cardiovascular: RRR Respiratory: Lung fields clear to ausculation GU/GI: Abdomen gravid, non-tender, non-distended, active FM, vertex Extremities: 1+ non-pitting edema, negative for pain, tenderness, and cords  Cervical exam:Dilation: 1 Effacement (%): 50 Station: -3 Exam by:: Williams CNM FHR: baseline rate 135 / variability moderate / accelerations present / absent decelerations TOCO: irreg   Prenatal Transfer Tool  Maternal Diabetes: No Genetic  Screening: Declined Maternal Ultrasounds/Referrals: Normal Fetal Ultrasounds or other Referrals:  None Maternal Substance Abuse:  No Significant Maternal Medications:  None Significant Maternal Lab Results: Group B Strep negative    Assessment: 30 y.o. G1P0 57w3dSROM  Latent stage of labor FHR category 1 GBS neg Pain management plan: nitrous oxide   Plan:  Admit to L&D Routine admission orders Epidural PRN  Dr KAlesia Richardsaware of admission and plan of care and Dr. DDelora Fuelupdated on admission  VArrie EasternMSN, CNM 09/13/2020 3:00 AM

## 2020-09-13 NOTE — MAU Note (Signed)
Pt reports she had a gush of fluid around 1110pm. Clear fluid. Continues to leak. Having some mild ctx on and off. Has not felt baby move much since water breaking.

## 2020-09-13 NOTE — Anesthesia Preprocedure Evaluation (Signed)
Anesthesia Evaluation  Patient identified by MRN, date of birth, ID band Patient awake    Reviewed: Allergy & Precautions, NPO status , Patient's Chart, lab work & pertinent test results  Airway Mallampati: II  TM Distance: >3 FB Neck ROM: Full    Dental no notable dental hx.    Pulmonary neg pulmonary ROS,    Pulmonary exam normal breath sounds clear to auscultation       Cardiovascular hypertension, Normal cardiovascular exam Rhythm:Regular Rate:Normal     Neuro/Psych negative neurological ROS  negative psych ROS   GI/Hepatic negative GI ROS, Neg liver ROS,   Endo/Other  Morbid obesity  Renal/GU negative Renal ROS  negative genitourinary   Musculoskeletal negative musculoskeletal ROS (+)   Abdominal (+) + obese,   Peds negative pediatric ROS (+)  Hematology negative hematology ROS (+)   Anesthesia Other Findings   Reproductive/Obstetrics (+) Pregnancy                             Anesthesia Physical Anesthesia Plan  ASA: 3  Anesthesia Plan: Epidural   Post-op Pain Management:    Induction:   PONV Risk Score and Plan:   Airway Management Planned:   Additional Equipment:   Intra-op Plan:   Post-operative Plan:   Informed Consent:   Plan Discussed with:   Anesthesia Plan Comments:         Anesthesia Quick Evaluation

## 2020-09-13 NOTE — Anesthesia Procedure Notes (Signed)
Epidural Patient location during procedure: OB Start time: 09/13/2020 9:45 PM End time: 09/13/2020 10:09 PM  Staffing Anesthesiologist: Lynda Rainwater, MD Performed: anesthesiologist   Preanesthetic Checklist Completed: patient identified, IV checked, site marked, risks and benefits discussed, surgical consent, monitors and equipment checked, pre-op evaluation and timeout performed  Epidural Patient position: sitting Prep: ChloraPrep Patient monitoring: heart rate, cardiac monitor, continuous pulse ox and blood pressure Approach: midline Location: L2-L3 Injection technique: LOR saline  Needle:  Needle type: Tuohy  Needle gauge: 17 G Needle length: 9 cm Needle insertion depth: 7 cm Catheter type: closed end flexible Catheter size: 20 Guage Catheter at skin depth: 11 cm Test dose: negative  Assessment Events: blood not aspirated, injection not painful, no injection resistance, no paresthesia and negative IV test  Additional Notes Reason for block:procedure for pain

## 2020-09-13 NOTE — Progress Notes (Signed)
Labor Progress Note  Kathryn Shepherd, 30 y.o., G1P0, with an IUP @ [redacted]w[redacted]d presented for PROM at 2300 on 8/25. Received care with Dr CLandry Mellowwith ESadie HaberOB. Obesity BMI 41. Fibroids. New onset GHTN, PCR 0.08, other labs unremarkable, asymptomatic.     Subjective: Pt in bed feeling cxt, and grimacing through them, discussed pain meds options, pt desires to get IV dose fentanyl now, then possible epidural after, discussed risk of infection and active management versus expectant management for PROM, discussed R/B/A of pitocin, pt verbalized consent. Pt denies HA, RUQ pain or vision changes.  Patient Active Problem List   Diagnosis Date Noted   Indication for care in labor or delivery 09/13/2020   Fibroid uterus 09/13/2020   Gestational hypertension 09/13/2020   PROM (premature rupture of membranes) 09/13/2020    Objective: BP (!) 141/79   Pulse 63   Temp 98.4 F (36.9 C) (Oral)   Resp 18   Ht '5\' 8"'$  (1.727 m)   Wt 123.4 kg   SpO2 97%   BMI 41.36 kg/m  I/O last 3 completed shifts: In: 406.3 [I.V.:406.3] Out: -  No intake/output data recorded. NST: FHR baseline 125 bpm, Variability: moderate, Accelerations:present, Decelerations:  Absent= Cat 1/Reactive CTX:  irregular, every 1-5 minutes, lasting 50 seconds Uterus gravid, soft non tender, mild to palpate with contractions.  SVE:  Dilation: 2 Effacement (%): 70 Station: -3 Exam by:: Kathryn Shepherd CNM Pitocin at (Plan to start now at 2) mUn/min  Assessment:  [name] 30y.o., G1P0, with an IUP @ 357w3dpresented for PROM at 2300 on 8/25. Received care with Dr CoLandry Mellowith [name]. Obesity BMI 41. Fibroids. New onset GHTN, PCR 0.08, other labs unremarkable, asymptomatic.  Patient Active Problem List   Diagnosis Date Noted   Indication for care in labor or delivery 09/13/2020   Fibroid uterus 09/13/2020   Gestational hypertension 09/13/2020   PROM (premature rupture of membranes) 09/13/2020   NICHD: Category  1  Membranes:  PROM @ 2300 on 8/25 x 20hrs, no s/s of infection  Induction:    Cytotec x5055mPO @ 0922, 1347  Foley Bulb: N/A  Pitocin - Plan to start now at 2   Pain management:               IV pain management: x 1927 on 8/26  Nitrous: PRN             Epidural placement: PRN  GBS Negative  GHTN: asymptomatic, PCR 0.08, other labs unremarkable, received IV labetalol once 8/26 @ 0306, BP currently 141/79   Plan: Continue labor plan Continuous monitoring Rest Ambulate Frequent position changes to facilitate fetal rotation and descent. Will reassess with cervical exam if necessary, avoid cervical check to decrease infection transmitting.  GHTN: Monitor BP, labetalol protocol if BP >160/110 Plan for epidural Start pitocin per protocol Anticipate labor progression and vaginal delivery.   Md Dillard aware of plan and verbalized agreement.   JadNoralyn PickP-C, CNM, MSN 09/13/2020. 8:28 PM

## 2020-09-13 NOTE — Progress Notes (Signed)
OB Progress Note  FHT reviewed: baseline 140bpm, moderate, - accels, - decels. No contractions on toco.   Original rupture of membrane time: 2310 on 8/25. Patient received 42mg buccal Cytotec at [phone] Patient ambulating, per primary RN. Plan to likely start Pitocin after 4 hours s/p Cytotec.  MDrema Dallas DO

## 2020-09-13 NOTE — MAU Provider Note (Signed)
Chief Complaint:  Rupture of Membranes   Event Date/Time   First Provider Initiated Contact with Patient 09/13/20 0146     HPI: Kathryn Shepherd is a 30 y.o. G1P0 at 60w3dho presents to maternity admissions reporting leaking fluids since 2310. She reports good fetal movement, denies vaginal bleeding, vaginal itching/burning, urinary symptoms, h/a, dizziness, n/v, diarrhea, constipation or fever/chills.    Vaginal Discharge The patient's primary symptoms include vaginal discharge. The patient's pertinent negatives include no genital itching, genital lesions, genital odor, pelvic pain or vaginal bleeding. This is a new problem. The current episode started today. She is pregnant. Pertinent negatives include no chills, constipation, diarrhea, dysuria, fever or flank pain. The vaginal discharge was clear and watery. There has been no bleeding. She has not been passing clots. She has not been passing tissue. Nothing aggravates the symptoms. She has tried nothing for the symptoms.    Past Medical History: Past Medical History:  Diagnosis Date   Medical history non-contributory     Past obstetric history: OB History  Gravida Para Term Preterm AB Living  1            SAB IAB Ectopic Multiple Live Births               # Outcome Date GA Lbr Len/2nd Weight Sex Delivery Anes PTL Lv  1 Current             Past Surgical History: Past Surgical History:  Procedure Laterality Date   ANTERIOR CRUCIATE LIGAMENT REPAIR      Family History: History reviewed. No pertinent family history.  Social History: Social History   Tobacco Use   Smoking status: Never   Smokeless tobacco: Never  Vaping Use   Vaping Use: Never used  Substance Use Topics   Alcohol use: Never   Drug use: Never    Allergies:  Allergies  Allergen Reactions   Asa [Aspirin]    Ibuprofen    Nickel    Latex Rash and Other (See Comments)    Facial swelling    Meds:  Medications Prior to Admission  Medication  Sig Dispense Refill Last Dose   Prenatal Vit-Fe Fumarate-FA (MULTIVITAMIN-PRENATAL) 27-0.8 MG TABS tablet Take 1 tablet by mouth daily at 12 noon.      cyclobenzaprine (FLEXERIL) 5 MG tablet Take 1 tablet (5 mg total) by mouth at bedtime as needed for muscle spasms. 20 tablet 0    PredniSONE 10 MG KIT 12 day dose pack po 1 kit 0    traMADol (ULTRAM) 50 MG tablet Take 1 tablet (50 mg total) by mouth every 6 (six) hours as needed. 15 tablet 0     I have reviewed patient's Past Medical Hx, Surgical Hx, Family Hx, Social Hx, medications and allergies.   ROS:  Review of Systems  Constitutional:  Negative for chills and fever.  Gastrointestinal:  Negative for constipation and diarrhea.  Genitourinary:  Positive for vaginal discharge. Negative for dysuria, flank pain and pelvic pain.  Other systems negative  Physical Exam  Patient Vitals for the past 24 hrs:  BP Temp Pulse Resp Height Weight  09/13/20 0127 (!) 154/87 -- 82 -- -- --  09/13/20 0105 (!) 162/95 97.9 F (36.6 C) 83 18 _0  (1.727 m) 123.4 kg   Constitutional: Well-developed, well-nourished female in no acute distress.  Cardiovascular: normal rate and rhythm Respiratory: normal effort, clear to auscultation bilaterally GI: Abd soft, non-tender, gravid appropriate for gestational age.   No rebound or guarding.  MS: Extremities nontender, no edema, normal ROM Neurologic: Alert and oriented x 4.  GU: Neg CVAT.  PELVIC EXAM: Cervix pink, without lesion, small watery discharge, vaginal walls and external genitalia normal     After exam, more watery liquid leaked out.  + Fern Dilation: 1 Effacement (%): 50 Cervical Position: Posterior Station: -3 Presentation: Vertex Exam by:: Jimmye Norman CNM  FHT:  Baseline 140 , moderate variability, accelerations present, no decelerations Contractions:  Irregular     Labs: Results for orders placed or performed during the hospital encounter of 09/13/20 (from the past 24 hour(s))  POCT fern  test     Status: Abnormal   Collection Time: 09/13/20  1:46 AM  Result Value Ref Range   POCT Fern Test Positive = ruptured amniotic membanes       Imaging:  No results found.  MAU Course/MDM: NST reviewed, reactive RN will be calling provider Treatments in MAU included EFM, SSE.    Assessment: Single IUP at 69w3dPROM  Plan: APatersonper MD   MHansel FeinsteinCNM, MSN Certified Nurse-Midwife 09/13/2020 1:46 AM

## 2020-09-14 ENCOUNTER — Encounter (HOSPITAL_COMMUNITY): Payer: Self-pay | Admitting: Obstetrics and Gynecology

## 2020-09-14 LAB — CBC
HCT: 35.9 % — ABNORMAL LOW (ref 36.0–46.0)
Hemoglobin: 11.8 g/dL — ABNORMAL LOW (ref 12.0–15.0)
MCH: 26.3 pg (ref 26.0–34.0)
MCHC: 32.9 g/dL (ref 30.0–36.0)
MCV: 80 fL (ref 80.0–100.0)
Platelets: 247 10*3/uL (ref 150–400)
RBC: 4.49 MIL/uL (ref 3.87–5.11)
RDW: 12.9 % (ref 11.5–15.5)
WBC: 15.4 10*3/uL — ABNORMAL HIGH (ref 4.0–10.5)
nRBC: 0 % (ref 0.0–0.2)

## 2020-09-14 MED ORDER — TETANUS-DIPHTH-ACELL PERTUSSIS 5-2.5-18.5 LF-MCG/0.5 IM SUSY: 0.5000 mL | PREFILLED_SYRINGE | Freq: Once | INTRAMUSCULAR | Status: DC

## 2020-09-14 MED ORDER — WITCH HAZEL-GLYCERIN EX PADS: 1.0000 "application " | MEDICATED_PAD | CUTANEOUS | Status: DC | PRN

## 2020-09-14 MED ORDER — DIBUCAINE (PERIANAL) 1 % EX OINT: 1.0000 "application " | TOPICAL_OINTMENT | CUTANEOUS | Status: DC | PRN

## 2020-09-14 MED ORDER — OXYCODONE HCL 5 MG PO TABS: 10.0000 mg | ORAL_TABLET | ORAL | Status: DC | PRN

## 2020-09-14 MED ORDER — OXYCODONE HCL 5 MG PO TABS: 5.0000 mg | ORAL_TABLET | ORAL | Status: DC | PRN

## 2020-09-14 MED ORDER — ONDANSETRON HCL 4 MG/2ML IJ SOLN: 4.0000 mg | INTRAMUSCULAR | Status: DC | PRN

## 2020-09-14 MED ORDER — SIMETHICONE 80 MG PO CHEW: 80.0000 mg | CHEWABLE_TABLET | ORAL | Status: DC | PRN

## 2020-09-14 MED ORDER — CYCLOBENZAPRINE HCL 10 MG PO TABS: 5.0000 mg | ORAL_TABLET | Freq: Every evening | ORAL | Status: DC | PRN

## 2020-09-14 MED ORDER — BENZOCAINE-MENTHOL 20-0.5 % EX AERO
1.0000 "application " | INHALATION_SPRAY | CUTANEOUS | Status: DC | PRN
  Filled 2020-09-14 (×2): qty 56

## 2020-09-14 MED ORDER — DIPHENHYDRAMINE HCL 25 MG PO CAPS: 25.0000 mg | ORAL_CAPSULE | Freq: Four times a day (QID) | ORAL | Status: DC | PRN

## 2020-09-14 MED ORDER — ZOLPIDEM TARTRATE 5 MG PO TABS: 5.0000 mg | ORAL_TABLET | Freq: Every evening | ORAL | Status: DC | PRN

## 2020-09-14 MED ORDER — PRENATAL MULTIVITAMIN CH
1.0000 | ORAL_TABLET | Freq: Every day | ORAL | Status: DC
  Administered 2020-09-15 – 2020-09-16 (×2): 1 via ORAL
  Filled 2020-09-14 (×2): qty 1

## 2020-09-14 MED ORDER — ACETAMINOPHEN 325 MG PO TABS
650.0000 mg | ORAL_TABLET | ORAL | Status: DC | PRN
  Administered 2020-09-14 – 2020-09-16 (×8): 650 mg via ORAL
  Filled 2020-09-14 (×8): qty 2

## 2020-09-14 MED ORDER — BUPIVACAINE HCL (PF) 0.25 % IJ SOLN
INTRAMUSCULAR | Status: DC | PRN
  Administered 2020-09-14 (×4): 5 mL via EPIDURAL

## 2020-09-14 MED ORDER — ONDANSETRON HCL 4 MG PO TABS: 4.0000 mg | ORAL_TABLET | ORAL | Status: DC | PRN

## 2020-09-14 MED ORDER — SENNOSIDES-DOCUSATE SODIUM 8.6-50 MG PO TABS
2.0000 | ORAL_TABLET | Freq: Every day | ORAL | Status: DC
  Administered 2020-09-16: 2 via ORAL
  Filled 2020-09-14: qty 2

## 2020-09-14 MED ORDER — COCONUT OIL OIL: 1.0000 "application " | TOPICAL_OIL | Status: DC | PRN

## 2020-09-14 NOTE — Progress Notes (Signed)
Labor Progress Note  Kathryn Shepherd, 30 y.o., G1P0, with an IUP @ [redacted]w[redacted]d presented for PROM at 2300 on 8/25. Received care with Dr CLandry Mellowwith Kathryn HaberOB. Obesity BMI 41. Fibroids. New onset GHTN, PCR 0.08, other labs unremarkable, asymptomatic.     Subjective: Pt in bed c/o pain in abdomen, Anesthesia was called  and re-bolus'ed epidural, post pt felt relaxed and pain resolved. No cervical change noted in last 12 hour, cxt hard to read, discussed risk of infection with IUPC, pt consent to IUPC placement. Pt denies HA, RUQ pain or vision changes.  Patient Active Problem List   Diagnosis Date Noted   Indication for care in labor or delivery 09/13/2020   Fibroid uterus 09/13/2020   Gestational hypertension 09/13/2020   PROM (premature rupture of membranes) 09/13/2020    Objective: BP 123/80   Pulse 88   Temp 98.6 F (37 C)   Resp 18   Ht '5\' 8"'$  (1.727 m)   Wt 123.4 kg   SpO2 100%   BMI 41.36 kg/m  I/O last 3 completed shifts: In: 406.3 [I.V.:406.3] Out: -  No intake/output data recorded. NST: FHR baseline 130 bpm, Variability: moderate, Accelerations:present, Decelerations:  Absent= Cat 1/Reactive CTX:  irregular, every 1-5 minutes, lasting 50 seconds Uterus gravid, soft non tender, moderate to palpate with contractions.  SVE:  Dilation: 4.5 Effacement (%): 90 Station: -2 Exam by:: Kathryn Shepherd at (12) mUn/min  Assessment:  Kathryn Shepherd 30y.o., G1P0, with an IUP @ 357w3dpresented for PROM at 2300 on 8/25. Received care with Dr CoLandry Mellowith [name]. Obesity BMI 41. Fibroids. New onset GHTN, PCR 0.08, other labs unremarkable, asymptomatic. Pt progressing in latent labor. RN instructed to continue to increase Shepherd.  Patient Active Problem List   Diagnosis Date Noted   Indication for care in labor or delivery 09/13/2020   Fibroid uterus 09/13/2020   Gestational hypertension 09/13/2020   PROM (premature rupture of membranes) 09/13/2020   NICHD: Category  1  Membranes:  PROM @ 2300 on 8/25 x 31hrs, no s/s of infection  IUPC placed, tolerated well, MVU 200s.   Induction:    Cytotec x5035mPO @ [id number] Bulb: N/A  Shepherd - 12  Pain management:               IV pain management: x 1927 on 8/26  Nitrous: PRN             Epidural placement: Placed @ 2209 on 8/27  GBS Negative  GHTN: asymptomatic, PCR 0.08, other labs unremarkable, received IV labetalol once 8/26 @ 0306, BP currently 123/80   Plan: Continue labor plan Continuous monitoring Rest Frequent position changes to facilitate fetal rotation and descent. Will reassess with cervical exam if necessary, avoid cervical check to decrease infection transmitting.  GHTN: Monitor BP, labetalol protocol if BP >160/110, recheck cmp, cbc in the morning.  Continue Shepherd per protocol 2x2, titrate to fetal tolerance or 250s MVUs.  Anticipate labor progression and vaginal delivery.   Kathryn PickP-C, CNM, MSN 09/14/2020. 12:52 PM

## 2020-09-14 NOTE — Progress Notes (Signed)
Pt called out c/o constant pressure, she is crying and overwhelmed.  SVE unchanged.  Anest MD notified.

## 2020-09-14 NOTE — Progress Notes (Signed)
Labor Progress Note  Kathryn Shepherd, 30 y.o., G1P0, with an IUP @ [redacted]w[redacted]d presented for PROM at 2300 on 8/25. Received care with Dr CLandry Mellowwith ESadie HaberOB. Obesity BMI 41. Fibroids. New onset GHTN, PCR 0.08, other labs unremarkable, asymptomatic.     Subjective: Pt resting comfortably in bed in NAD.  Patient Active Problem List   Diagnosis Date Noted   Indication for care in labor or delivery 09/13/2020   Fibroid uterus 09/13/2020   Gestational hypertension 09/13/2020   PROM (premature rupture of membranes) 09/13/2020    Objective: BP (!) 133/92   Pulse 78   Temp (!) 97.4 F (36.3 C) (Oral)   Resp 17   Ht '5\' 8"'$  (1.727 m)   Wt 123.4 kg   SpO2 100%   BMI 41.36 kg/m  I/O last 3 completed shifts: In: 406.3 [I.V.:406.3] Out: -  No intake/output data recorded. NST: FHR baseline 130 bpm, Variability: moderate, Accelerations:present, Decelerations:  Absent= Cat 1/Reactive CTX:  irregular, every 1-5 minutes, lasting 60-100  seconds Uterus gravid, soft non tender, moderate to palpate with contractions.  SVE:  Dilation: 5.5 Effacement (%): 90 Station: -2 Exam by:: Dayelin Balducci CNM Pitocin at (333 mUn/min  Assessment:  Kathryn Shepherd, 30y.o., G1P0, with an IUP @ 338w3dpresented for PROM at 2300 on 8/25. Received care with Dr CoLandry Mellowith [name]. Obesity BMI 41. Fibroids. New onset GHTN, PCR 0.08, other labs unremarkable, asymptomatic. Pt progressing in latent labor.  Patient Active Problem List   Diagnosis Date Noted   Indication for care in labor or delivery 09/13/2020   Fibroid uterus 09/13/2020   Gestational hypertension 09/13/2020   PROM (premature rupture of membranes) 09/13/2020   NICHD: Category 1  Membranes:  PROM @ 2300 on 8/25 x 42hrs, no s/s of infection  IUPC placed, tolerated well, MVU 200s.   Induction:    Cytotec x5080mPO @ [id number] Bulb: N/A  Pitocin - 32  Pain management:               IV pain management: x 1927 on 8/26  Nitrous: PRN              Epidural placement: Placed @ 2209 on 8/27  GBS Negative  GHTN: asymptomatic, PCR 0.08, other labs unremarkable, received IV labetalol once 8/26 @ 0306, BP currently 133/92   Plan: Continue labor plan Continuous monitoring Rest Frequent position changes to facilitate fetal rotation and descent. Will reassess with cervical exam if necessary, avoid cervical check to decrease infection transmitting.  GHTN: Monitor BP, labetalol protocol if BP >160/110, recheck cmp, cbc in the morning.  Continue pitocin per protocol 2x2, titrate to fetal tolerance or 250s MVUs.  Anticipate labor progression and vaginal delivery.   JadNoralyn PickP-C, CNM, MSN 09/14/2020. 5:03 PM

## 2020-09-14 NOTE — Plan of Care (Signed)

## 2020-09-14 NOTE — Lactation Note (Signed)
This note was copied from a baby's chart. Lactation Consultation Note  Patient Name: Kathryn Shepherd [id number] Date: 09/14/2020   Age:31 hours Per mom ,she attended breastfeeding classes in her pregnancy.  LC entered the room, infant was being weighed, per mom, infant latched well she only felt a tug with latch no pain, infant breastfeed for 10 minutes. LC did not observe latch at this time.  Mom was taught hand expression and infant was given 3 mls of colostrum by spoon.  Mom knows to call RN or [place] on M[facility] if she has any questions, concerns or need assistance with latching infant at the breast.  Mom knows to breastfeed infant according to primal cues, licking, smacking, tasting, hand and fist in mouth and rooting, breastfeeding infant skin to skin.  Maternal Data    Feeding    LATCH Score                    Lactation Tools Discussed/Used    Interventions    Discharge    Consult Status      Vicente Serene 09/14/2020, 8:20 PM

## 2020-09-14 NOTE — Progress Notes (Signed)
Labor Progress Note  Kathryn Shepherd, 30 y.o., G1P0, with an IUP @ [redacted]w[redacted]d presented for PROM at 2300 on 8/25. Received care with Dr CLandry Mellowwith ESadie HaberOB. Obesity BMI 41. Fibroids. New onset GHTN, PCR 0.08, other labs unremarkable, asymptomatic.     Subjective: Pt in bed was able to rest well, comfortable with epidural, reviewed POC and labor curve. Pt denies HA, RUQ pain or vision changes.  Patient Active Problem List   Diagnosis Date Noted   Indication for care in labor or delivery 09/13/2020   Fibroid uterus 09/13/2020   Gestational hypertension 09/13/2020   PROM (premature rupture of membranes) 09/13/2020    Objective: BP (!) 147/91   Pulse 93   Temp 98.4 F (36.9 C)   Resp 18   Ht '5\' 8"'$  (1.727 m)   Wt 123.4 kg   SpO2 97%   BMI 41.36 kg/m  I/O last 3 completed shifts: In: 406.3 [I.V.:406.3] Out: -  No intake/output data recorded. NST: FHR baseline 135 bpm, Variability: moderate, Accelerations:present, Decelerations:  Absent= Cat 1/Reactive CTX:  irregular, every 1-5 minutes, lasting 50 seconds Uterus gravid, soft non tender, moderate to palpate with contractions.  SVE:  Dilation: 4 Effacement (%): 70 Station: -2 Exam by:: MPilar Jarvis RN Pitocin at (12) mUn/min  Assessment:  Kathryn Shepherd 30y.o., G1P0, with an IUP @ 368w3dpresented for PROM at 2300 on 8/25. Received care with Dr CoLandry Mellowith [name]. Obesity BMI 41. Fibroids. New onset GHTN, PCR 0.08, other labs unremarkable, asymptomatic. Pt progressing in latent labor.  Patient Active Problem List   Diagnosis Date Noted   Indication for care in labor or delivery 09/13/2020   Fibroid uterus 09/13/2020   Gestational hypertension 09/13/2020   PROM (premature rupture of membranes) 09/13/2020   NICHD: Category 1  Membranes:  PROM @ 2300 on 8/25 x 31hrs, no s/s of infection  Induction:    Cytotec x5027mPO @ 0922, 1347  Foley Bulb: N/A  Pitocin - 12  Pain management:               IV pain management: x  1927 on 8/26  Nitrous: PRN             Epidural placement: Placed @ 2209 on 8/27  GBS Negative  GHTN: asymptomatic, PCR 0.08, other labs unremarkable, received IV labetalol once 8/26 @ 0306, BP currently 147/91   Plan: Continue labor plan Continuous monitoring Rest Frequent position changes to facilitate fetal rotation and descent. Will reassess with cervical exam if necessary, avoid cervical check to decrease infection transmitting.  GHTN: Monitor BP, labetalol protocol if BP >160/110 Continue pitocin per protocol 2x2 Anticipate labor progression and vaginal delivery.   JadNoralyn PickP-C, CNM, MSN 09/14/2020. 6:59 AM

## 2020-09-15 DIAGNOSIS — D62 Acute posthemorrhagic anemia: Secondary | ICD-10-CM | POA: Diagnosis not present

## 2020-09-15 LAB — COMPREHENSIVE METABOLIC PANEL
ALT: 14 U/L (ref 0–44)
AST: 20 U/L (ref 15–41)
Albumin: 2.3 g/dL — ABNORMAL LOW (ref 3.5–5.0)
Alkaline Phosphatase: 101 U/L (ref 38–126)
Anion gap: 6 (ref 5–15)
BUN: 7 mg/dL (ref 6–20)
CO2: 21 mmol/L — ABNORMAL LOW (ref 22–32)
Calcium: 8.9 mg/dL (ref 8.9–10.3)
Chloride: 108 mmol/L (ref 98–111)
Creatinine, Ser: 0.71 mg/dL (ref 0.44–1.00)
GFR, Estimated: >60 mL/min (ref >60)
Glucose, Bld: 88 mg/dL (ref 70–99)
Potassium: 3.7 mmol/L (ref 3.5–5.1)
Sodium: 135 mmol/L (ref 135–145)
Total Bilirubin: 0.3 mg/dL (ref 0.3–1.2)
Total Protein: 5.2 g/dL — ABNORMAL LOW (ref 6.5–8.1)

## 2020-09-15 LAB — CBC
HCT: 29.7 % — ABNORMAL LOW (ref 36.0–46.0)
Hemoglobin: 9.8 g/dL — ABNORMAL LOW (ref 12.0–15.0)
MCH: 26.1 pg (ref 26.0–34.0)
MCHC: 33 g/dL (ref 30.0–36.0)
MCV: 79 fL — ABNORMAL LOW (ref 80.0–100.0)
Platelets: 215 10*3/uL (ref 150–400)
RBC: 3.76 MIL/uL — ABNORMAL LOW (ref 3.87–5.11)
RDW: 13 % (ref 11.5–15.5)
WBC: 14.2 10*3/uL — ABNORMAL HIGH (ref 4.0–10.5)
nRBC: 0 % (ref 0.0–0.2)

## 2020-09-15 MED ORDER — MAGNESIUM OXIDE -MG SUPPLEMENT 400 (240 MG) MG PO TABS
400.0000 mg | ORAL_TABLET | Freq: Every day | ORAL | Status: DC
  Administered 2020-09-15 – 2020-09-16 (×2): 400 mg via ORAL
  Filled 2020-09-15 (×2): qty 1

## 2020-09-15 MED ORDER — POLYSACCHARIDE IRON COMPLEX 150 MG PO CAPS
150.0000 mg | ORAL_CAPSULE | Freq: Every day | ORAL | Status: DC
  Administered 2020-09-15 – 2020-09-16 (×2): 150 mg via ORAL
  Filled 2020-09-15 (×2): qty 1

## 2020-09-15 MED ORDER — NIFEDIPINE ER OSMOTIC RELEASE 30 MG PO TB24
30.0000 mg | ORAL_TABLET | Freq: Every day | ORAL | Status: DC
  Administered 2020-09-15 – 2020-09-16 (×2): 30 mg via ORAL
  Filled 2020-09-15 (×2): qty 1

## 2020-09-15 NOTE — Progress Notes (Signed)
PPD# 1 SVD w/ intact perineum Information for the patient's newborn:  Kathryn Shepherd, Kathryn Shepherd [id number]  female   Baby Name Kathryn Shepherd Circumcision desires in-pt cirx   S:   Reports feeling "very good", denies HA, visual changes, and RUQ pain Tolerating PO fluid and solids No nausea or vomiting Bleeding is light and not clots Pain controlled with  PO meds Up ad lib / ambulatory / voiding w/o difficulty Feeding: Breast    O:   VS: BP 131/77   Pulse 87   Temp 98 F (36.7 C)   Resp 17   Ht '5\' 8"'$  (1.727 m)   Wt 123.4 kg   SpO2 99%   Breastfeeding Unknown   BMI 41.36 kg/m   LABS:  Recent Labs    09/14/20 2008 09/15/20 0504  WBC 15.4* 14.2*  HGB 11.8* 9.8*  PLT 247 215   Blood type: --/--/O POS (08/26 0218) Rubella:                        I&O: Intake/Output      08/27 0701 08/28 0700 08/28 0701 08/29 0700   Urine (mL/kg/hr) 1200 (0.4)    Blood 57    Total Output 1257    Net -1257           Physical Exam: Alert and oriented X3 Lungs: Clear and unlabored Heart: regular rate and rhythm / no mumurs Abdomen: soft, non-tender, non-distended  Fundus: firm, non-tender Perineum: intact, no hemorrhoids Lochia: appropriate Extremities: 1+ edema, no calf pain or tenderness    A:  PPD # 1 GHTN    -mild range BP's since starting Procardia  P:  Routine post partum orders Anticipate D/C on 09/16/20   Kathryn Eastern, MSN, CNM 09/15/2020, 10:21 AM

## 2020-09-15 NOTE — Anesthesia Postprocedure Evaluation (Signed)
Anesthesia Post Note  Patient: Kathryn Shepherd  Procedure(s) Performed: AN AD [place]     Patient location during evaluation: Mother Baby Anesthesia Type: Epidural Level of consciousness: awake and alert Pain management: pain level controlled Vital Signs Assessment: post-procedure vital signs reviewed and stable Respiratory status: spontaneous breathing and respiratory function stable Cardiovascular status: blood pressure returned to baseline Postop Assessment: no headache, able to ambulate and no apparent nausea or vomiting Anesthetic complications: no   No notable events documented.  Last Vitals:  Vitals:   09/15/20 0641 09/15/20 0834  BP: 134/84 131/77  Pulse: 74 87  Resp:    Temp: 36.7 C 36.7 C  SpO2: 100% 99%    Last Pain:  Vitals:   09/15/20 0839  TempSrc:   PainSc: 6    Pain Goal:                   [name]

## 2020-09-16 ENCOUNTER — Ambulatory Visit: Payer: Self-pay

## 2020-09-16 MED ORDER — NIFEDIPINE ER 30 MG PO TB24: 30.0000 mg | ORAL_TABLET | Freq: Every day | ORAL | 3 refills | Status: DC

## 2020-09-16 MED ORDER — ACETAMINOPHEN 325 MG PO TABS: 650.0000 mg | ORAL_TABLET | Freq: Four times a day (QID) | ORAL | 3 refills | Status: AC | PRN

## 2020-09-16 NOTE — Discharge Summary (Addendum)
Postpartum Discharge Summary  Date of Service: 09/16/20      Patient Name: Kathryn Shepherd DOB: 1990/06/26 MRN: [id number]  Date of admission: 09/13/2020 Delivery date:09/14/2020  Delivering provider: Noralyn Pick  Date of discharge: 09/16/2020  Admitting diagnosis: Gestational hypertension [O13.9] Normal labor and delivery [O80] PROM Intrauterine pregnancy: [redacted]w[redacted]d    Secondary diagnosis:  Active Problems:   Indication for care in labor or delivery   Fibroid uterus   Gestational hypertension   PROM (premature rupture of membranes)   SVD (spontaneous vaginal delivery)   Normal postpartum course   ABLA (acute blood loss anemia)  Additional problems: None    Discharge diagnosis: Term Pregnancy Delivered                                              Post partum procedures: None Augmentation: Pitocin and Cytotec Complications: None  Hospital course: Induction of Labor With Vaginal Delivery   30y.o. yo G1P1001 at 339w4das admitted to the hospital 09/13/2020 for induction of labor.  Indication for induction: PROM.  Patient had an uncomplicated labor course as follows: Membrane Rupture Time/Date: 11:10 PM ,09/12/2020   Delivery Method:Vaginal, Spontaneous  Episiotomy: None  Lacerations:  None  Details of delivery can be found in separate delivery note.  Patient had a routine postpartum course. Patient is discharged home 09/16/20.  Circumcision Consent: Routine circumcisions performed on newborns have been identified as voluntary, elective procedures by [facility] [employer] as the [employer].  It is considered an elective procedure with no definitive medical indication and carries risks.  Risks include but are not limited to bleeding, infection, damage to penis with possible need for further surgery, poor cosmesis, and local anesthetic risks.  Circumcision will only be performed if patient is deemed to have normal anatomy by his Pediatrician, meets  adequate criteria for a newborn of similar gestational age after birth and is without infection or other medical issue contraindicating an elective procedure.   Patient understands and agrees with above consent Patient discussed with mother of infant.   Newborn Data: Birth date:09/14/2020  Birth time:6:49 PM  Gender:Female  Living status:Living  Apgars:7 ,9  Weight:3099 g   Magnesium Sulfate received: No BMZ received: No Rhophylac:N/A MMR:N/A T-DaP:Given prenatally Flu: N/A Transfusion:No  Physical exam  Vitals:   09/15/20 2017 09/15/20 2318 09/16/20 0350 09/16/20 0750  BP: 127/78 131/87 128/82 (!) 141/89  Pulse: 84 83 73 84  Resp: 18 18 18 18   Temp: 98.6 F (37 C) 98.7 F (37.1 C) 98.3 F (36.8 C) (!) 97.4 F (36.3 C)  TempSrc: Oral Oral Oral Oral  SpO2: 98% 99% 100% 98%  Weight:      Height:       General: alert, cooperative, and no distress Cardio: RRR Lungs:  CTAB, no wheezes/rales/rhonchi Lochia: appropriate Uterine Fundus: firm Incision: N/A DVT Evaluation: No evidence of DVT seen on physical exam. No cords or calf tenderness. Labs: Lab Results  Component Value Date   WBC 14.2 (H) 09/15/2020   HGB 9.8 (L) 09/15/2020   HCT 29.7 (L) 09/15/2020   MCV 79.0 (L) 09/15/2020   PLT 215 09/15/2020   CMP Latest Ref Rng & Units 09/15/2020  Glucose 70 - 99 mg/dL 88  BUN 6 - 20 mg/dL 7  Creatinine 0.44 - 1.00 mg/dL 0.71  Sodium 135 - 145 mmol/L 135  Potassium 3.5 - 5.1 mmol/L 3.7  Chloride 98 - 111 mmol/L 108  CO2 22 - 32 mmol/L 21(L)  Calcium 8.9 - 10.3 mg/dL 8.9  Total Protein 6.5 - 8.1 g/dL 5.2(L)  Total Bilirubin 0.3 - 1.2 mg/dL 0.3  Alkaline Phos 38 - 126 U/L 101  AST 15 - 41 U/L 20  ALT 0 - 44 U/L 14   Edinburgh Score: Edinburgh Postnatal Depression Scale Screening Tool 09/15/2020  I have been able to laugh and see the funny side of things. 0  I have looked forward with enjoyment to things. 0  I have blamed myself unnecessarily when things went  wrong. 1  I have been anxious or worried for no good reason. 0  I have felt scared or panicky for no good reason. 0  Things have been getting on top of me. 1  I have been so unhappy that I have had difficulty sleeping. 0  I have felt sad or miserable. 0  I have been so unhappy that I have been crying. 0  The thought of harming myself has occurred to me. 0  Edinburgh Postnatal Depression Scale Total 2      After visit meds:  Allergies as of 09/16/2020       Reactions   Asa [aspirin]    Ibuprofen    Nickel    Latex Rash, Other (See Comments)   Facial swelling        Medication List     STOP taking these medications    PredniSONE 10 MG Kit   traMADol 50 MG tablet Commonly known as: ULTRAM       TAKE these medications    acetaminophen 325 MG tablet Commonly known as: Tylenol Take 2 tablets (650 mg total) by mouth every 6 (six) hours as needed for moderate pain, fever or headache (for pain scale < 4).   cyclobenzaprine 5 MG tablet Commonly known as: FLEXERIL Take 1 tablet (5 mg total) by mouth at bedtime as needed for muscle spasms.   multivitamin-prenatal 27-0.8 MG Tabs tablet Take 1 tablet by mouth daily at 12 noon.   NIFEdipine 30 MG 24 hr tablet Commonly known as: ADALAT CC Take 1 tablet (30 mg total) by mouth daily.         Discharge home in stable condition Infant Feeding: Breast Infant Disposition:home with mother Discharge instruction: per After Visit Summary and Postpartum booklet. Activity: Advance as tolerated. Pelvic rest for 6 weeks.  Diet: routine diet Anticipated Birth Control:  None at this time Postpartum Appointment:6 weeks Additional Postpartum F/U: BP check 1 week Future Appointments:No future appointments. Follow up Visit:  Follow-up Information     Christophe Louis, MD Follow up in 1 week(s).   Specialty: Obstetrics and Gynecology Why: Our office will arrange a 1 week blood pressure check for you. You will also have a 6 week  postpartum visit scheduled. Contact information: [address] [place] [address] [phone]                     09/16/2020 Kathryn Dallas, DO

## 2020-09-16 NOTE — Lactation Note (Signed)
This note was copied from a baby's chart. Lactation Consultation Note  Patient Name: Kathryn Shepherd [id number] Date: 09/16/2020 Reason for consult: Follow-up assessment Age:31 hours   LC Follow Up Note:  RN requested a lactation visit due to mother's sore nipples.  Arrived to find family resting; mother requested I return later this morning.  Asked her to call for my assistance when ready.   Maternal Data    Feeding    LATCH Score                    Lactation Tools Discussed/Used    Interventions    Discharge    Consult Status Consult Status: Follow-up Date: 09/16/20 Follow-up type: In-patient    Carrera Kiesel R Denya Buckingham 09/16/2020, 7:12 AM

## 2020-09-16 NOTE — Lactation Note (Signed)
This note was copied from a baby's chart. Lactation Consultation Note  Patient Name: Kathryn Shepherd [id number] Date: 09/16/2020 Reason for consult: Follow-up assessment;Early term 37-38.6wks Age:31 hours  LC in to room for follow up. Mother states infant has a good latch and able to transfer milk. Discussed ETI behavior and bili results.   Reinforced using DEBP, initiation setting. Encouraged to use after each feeding. Reviewed milk storage and proper care of parts. Reviewed hand expression. Encouraged to contact [place] to observe next feeding.   All questions answered at this time. Mother is able to teach back feeding plan.    Maternal Data Has patient been taught Hand Expression?: Yes Does the patient have breastfeeding experience prior to this delivery?: No  Feeding Mother's Current Feeding Choice: Breast Milk  Lactation Tools Discussed/Used Tools: Pump;Flanges;Coconut oil;Comfort gels Flange Size: 27 Breast pump type: Double-Electric Breast Pump;Manual Pump Education: Setup, frequency, and cleaning;Milk Storage Reason for Pumping: stimulation and supplementation, ETI, high bili Pumping frequency: after feedings with initiation setting  Interventions Interventions: DEBP;Coconut oil;Expressed milk;Education;Hand express;Breast massage;Breast feeding basics reviewed  Discharge Discharge Education: Engorgement and breast care Pump: DEBP;Manual  Consult Status Consult Status: Follow-up Date: 09/17/20 Follow-up type: Call as needed    [place] 09/16/2020, 5:58 PM

## 2020-09-17 ENCOUNTER — Ambulatory Visit: Payer: Self-pay

## 2020-09-17 NOTE — Lactation Note (Signed)
This note was copied from a baby's chart. Lactation Consultation Note  Patient Name: Kathryn Shepherd [id number] Date: 09/17/2020 Reason for consult: Follow-up assessment;Hyperbilirubinemia Age:31 hours  LC in to room for follow up.  Mother shares her excitement because her milk is in, and collected ~20 mL. Mother explains baby is going to breast first and then getting expressed breast milk and donor milk. Talked about volume according to age and encouraged to offer more volume to baby as he tolerates it.  Reinforced maternal rest and general self-care.   LC was not able to evaluate latch at this time, but mother reports some discomfort and pain at times. Encouraged to share this concern with pediatrician during follow up.   Feeding Mother's Current Feeding Choice: Breast Milk and Donor Milk  Lactation Tools Discussed/Used Tools: Pump;Flanges;Coconut oil Breast pump type: Double-Electric Breast Pump;Manual Reason for Pumping: Hyperbilirubinemia Pumping frequency: Q3 Pumped volume: 20 mL  Interventions Interventions: DEBP;Expressed milk;Education  Discharge Pump: DEBP  Consult Status Consult Status: Follow-up Date: 09/18/20 Follow-up type: In-patient    Yaelis Scharfenberg A Higuera Ancidey 09/17/2020, 11:02 PM

## 2020-09-18 ENCOUNTER — Encounter (HOSPITAL_COMMUNITY): Payer: Self-pay | Admitting: Obstetrics and Gynecology

## 2020-09-18 ENCOUNTER — Other Ambulatory Visit: Payer: Self-pay

## 2020-09-18 ENCOUNTER — Ambulatory Visit: Payer: Self-pay

## 2020-09-18 ENCOUNTER — Inpatient Hospital Stay (HOSPITAL_COMMUNITY)
Admission: AD | Admit: 2020-09-18 | Discharge: 2020-09-18 | Disposition: A | Discharge status: Home / Self Care | Payer: 59 | Attending: Obstetrics and Gynecology | Admitting: Obstetrics and Gynecology

## 2020-09-18 ENCOUNTER — Inpatient Hospital Stay (HOSPITAL_COMMUNITY): Payer: 59

## 2020-09-18 DIAGNOSIS — R0602 Shortness of breath: Secondary | ICD-10-CM | POA: Insufficient documentation

## 2020-09-18 DIAGNOSIS — O135 Gestational [pregnancy-induced] hypertension without significant proteinuria, complicating the puerperium: Secondary | ICD-10-CM

## 2020-09-18 DIAGNOSIS — R062 Wheezing: Secondary | ICD-10-CM

## 2020-09-18 DIAGNOSIS — O165 Unspecified maternal hypertension, complicating the puerperium: Secondary | ICD-10-CM

## 2020-09-18 DIAGNOSIS — Z886 Allergy status to analgesic agent status: Secondary | ICD-10-CM | POA: Insufficient documentation

## 2020-09-18 DIAGNOSIS — Z8759 Personal history of other complications of pregnancy, childbirth and the puerperium: Secondary | ICD-10-CM

## 2020-09-18 LAB — CBC
HCT: 31.1 % — ABNORMAL LOW (ref 36.0–46.0)
Hemoglobin: 10.4 g/dL — ABNORMAL LOW (ref 12.0–15.0)
MCH: 26.4 pg (ref 26.0–34.0)
MCHC: 33.4 g/dL (ref 30.0–36.0)
MCV: 78.9 fL — ABNORMAL LOW (ref 80.0–100.0)
Platelets: 252 10*3/uL (ref 150–400)
RBC: 3.94 MIL/uL (ref 3.87–5.11)
RDW: 13.1 % (ref 11.5–15.5)
WBC: 8.7 10*3/uL (ref 4.0–10.5)
nRBC: 0 % (ref 0.0–0.2)

## 2020-09-18 LAB — COMPREHENSIVE METABOLIC PANEL
ALT: 25 U/L (ref 0–44)
AST: 29 U/L (ref 15–41)
Albumin: 2.8 g/dL — ABNORMAL LOW (ref 3.5–5.0)
Alkaline Phosphatase: 89 U/L (ref 38–126)
Anion gap: 11 (ref 5–15)
BUN: 10 mg/dL (ref 6–20)
CO2: 21 mmol/L — ABNORMAL LOW (ref 22–32)
Calcium: 9.2 mg/dL (ref 8.9–10.3)
Chloride: 105 mmol/L (ref 98–111)
Creatinine, Ser: 0.85 mg/dL (ref 0.44–1.00)
GFR, Estimated: >60 mL/min (ref >60)
Glucose, Bld: 87 mg/dL (ref 70–99)
Potassium: 3.9 mmol/L (ref 3.5–5.1)
Sodium: 137 mmol/L (ref 135–145)
Total Bilirubin: 0.4 mg/dL (ref 0.3–1.2)
Total Protein: 6 g/dL — ABNORMAL LOW (ref 6.5–8.1)

## 2020-09-18 LAB — GLUCOSE, CAPILLARY: Glucose-Capillary: 87 mg/dL (ref 70–99)

## 2020-09-18 MED ORDER — BUTALBITAL-APAP-CAFFEINE 50-325-40 MG PO TABS
2.0000 | ORAL_TABLET | Freq: Once | ORAL | Status: AC
  Administered 2020-09-18: 2 via ORAL
  Filled 2020-09-18: qty 2

## 2020-09-18 MED ORDER — NIFEDIPINE ER OSMOTIC RELEASE 30 MG PO TB24: 30.0000 mg | ORAL_TABLET | Freq: Every day | ORAL | Status: DC

## 2020-09-18 MED ORDER — NIFEDIPINE ER OSMOTIC RELEASE 30 MG PO TB24
60.0000 mg | ORAL_TABLET | Freq: Once | ORAL | Status: AC
  Administered 2020-09-18: 60 mg via ORAL
  Filled 2020-09-18: qty 2

## 2020-09-18 MED ORDER — NIFEDIPINE ER 60 MG PO TB24: 60.0000 mg | ORAL_TABLET | Freq: Every day | ORAL | 3 refills | Status: AC

## 2020-09-18 NOTE — MAU Note (Signed)
Pt reports to MAU c/o chills and SOB that started at 0315 when she got up to make her baby a bottle. Pt states increased swelling in feet and legs since delivery on 8/27. Pt states she has a headache and rates it 7/10. Pt denies any visual disturbances.

## 2020-09-18 NOTE — MAU Provider Note (Signed)
History     CSN: [id number]  Arrival date and time: 09/18/20 [id number]   Event Date/Time   First Provider Initiated Contact with Patient 09/18/20 0404      Chief Complaint  Patient presents with   Shortness of Breath   Chills   Headache   Kathryn Shepherd is a 30 y.o. G1P1001 at 4 Days Postpartum from a SVD complicated by SD. She is being followed by Joneen Caraway.  She presents today for Chills and Headache, while rooming in with her infant on the [facility].  She reports her HA has been present for "a couple of days," and has been "off and on."  Patient states HA is located on her right side and she describes it as pressure.  She reports she has not taken anything for it as she contributed it to sleep deprivation.  She feels this (sleep deprivation) is making it worse as well as light.  She states "darkness" makes the pain better. Patient reports that she has been experiencing SOB throughout the pregnancy and was given a inhaler.  She reports she had an incident prior to arrival as well as this morning. She states she has been rooming in with infant who is under lights.    OB History     Gravida  1   Para  1   Term  1   Preterm      AB      Living  1      SAB      IAB      Ectopic      Multiple  0   Live Births  1           Past Medical History:  Diagnosis Date   Medical history non-contributory     Past Surgical History:  Procedure Laterality Date   ANTERIOR CRUCIATE LIGAMENT REPAIR      History reviewed. No pertinent family history.  Social History   Tobacco Use   Smoking status: Never   Smokeless tobacco: Never  Vaping Use   Vaping Use: Never used  Substance Use Topics   Alcohol use: Never   Drug use: Never    Allergies:  Allergies  Allergen Reactions   Asa [Aspirin]    Ibuprofen    Nickel    Latex Rash and Other (See Comments)    Facial swelling    Medications Prior to Admission  Medication Sig Dispense Refill Last Dose   NIFEdipine  (ADALAT CC) 30 MG 24 hr tablet Take 1 tablet (30 mg total) by mouth daily. 30 tablet 3 09/17/2020   acetaminophen (TYLENOL) 325 MG tablet Take 2 tablets (650 mg total) by mouth every 6 (six) hours as needed for moderate pain, fever or headache (for pain scale < 4). 60 tablet 3    cyclobenzaprine (FLEXERIL) 5 MG tablet Take 1 tablet (5 mg total) by mouth at bedtime as needed for muscle spasms. 20 tablet 0    Prenatal Vit-Fe Fumarate-FA (MULTIVITAMIN-PRENATAL) 27-0.8 MG TABS tablet Take 1 tablet by mouth daily at 12 noon.       Review of Systems  Constitutional:  Positive for chills. Negative for fever.  HENT:  Positive for rhinorrhea.   Eyes:  Negative for visual disturbance.  Respiratory:  Positive for wheezing.   Gastrointestinal:  Negative for abdominal pain, nausea and vomiting.  Genitourinary:  Positive for vaginal bleeding. Negative for difficulty urinating, dysuria and vaginal discharge.  Musculoskeletal:  Negative for back pain.  Neurological:  Positive for headaches. Negative for dizziness and light-headedness.  Physical Exam   Blood pressure (!) 152/96, pulse 91, temperature 99 F (37.2 C), temperature source Oral, resp. rate 20, SpO2 97 %, unknown if currently breastfeeding. Vitals:   09/18/20 0342 09/18/20 0355 09/18/20 0400  BP: (!) 150/97 (!) 156/97 (!) 152/96  Pulse: 97 94 91  Resp: 20    Temp: 99 F (37.2 C)    TempSrc: Oral    SpO2: 97% 96% 97%   Vitals:   09/18/20 0520 09/18/20 0525 09/18/20 0530 09/18/20 0545  BP:   (!) 143/85 135/77  Pulse:   88 84  Resp:      Temp:      TempSrc:      SpO2: 97% 94% 97% 99%     Physical Exam Vitals reviewed.  Constitutional:      Appearance: She is well-developed. She is obese.  HENT:     Head: Normocephalic and atraumatic.  Eyes:     Conjunctiva/sclera: Conjunctivae normal.  Cardiovascular:     Rate and Rhythm: Normal rate and regular rhythm.     Heart sounds: Normal heart sounds.  Pulmonary:     Effort: Pulmonary  effort is normal.     Breath sounds: Examination of the right-upper field reveals wheezing. Examination of the left-upper field reveals wheezing. Examination of the right-middle field reveals wheezing. Examination of the left-middle field reveals wheezing. Examination of the right-lower field reveals wheezing. Examination of the left-lower field reveals wheezing. Wheezing present. No decreased breath sounds, rhonchi or rales.     Comments: Wheezes clear with cough Musculoskeletal:        General: Normal range of motion.     Cervical back: Normal range of motion.     Right lower leg: Edema present.     Left lower leg: Edema present.  Skin:    General: Skin is warm and dry.  Neurological:     Mental Status: She is alert and oriented to person, place, and time.  Psychiatric:        Mood and Affect: Mood normal.        Behavior: Behavior normal.    MAU Course  Procedures Results for orders placed or performed during the hospital encounter of 09/18/20 (from the past 24 hour(s))  Glucose, capillary     Status: None   Collection Time: 09/18/20  3:42 AM  Result Value Ref Range   Glucose-Capillary 87 70 - 99 mg/dL  CBC     Status: Abnormal   Collection Time: 09/18/20  4:03 AM  Result Value Ref Range   WBC 8.7 4.0 - 10.5 K/uL   RBC 3.94 3.87 - 5.11 MIL/uL   Hemoglobin 10.4 (L) 12.0 - 15.0 g/dL   HCT 31.1 (L) 36.0 - 46.0 %   MCV 78.9 (L) 80.0 - 100.0 fL   MCH 26.4 26.0 - 34.0 pg   MCHC 33.4 30.0 - 36.0 g/dL   RDW 13.1 11.5 - 15.5 %   Platelets 252 150 - 400 K/uL   nRBC 0.0 0.0 - 0.2 %  Comprehensive metabolic panel     Status: Abnormal   Collection Time: 09/18/20  4:03 AM  Result Value Ref Range   Sodium 137 135 - 145 mmol/L   Potassium 3.9 3.5 - 5.1 mmol/L   Chloride 105 98 - 111 mmol/L   CO2 21 (L) 22 - 32 mmol/L   Glucose, Bld 87 70 - 99 mg/dL   BUN 10 6 - 20 mg/dL  Creatinine, Ser 0.85 0.44 - 1.00 mg/dL   Calcium 9.2 8.9 - 10.3 mg/dL   Total Protein 6.0 (L) 6.5 - 8.1 g/dL    Albumin 2.8 (L) 3.5 - 5.0 g/dL   AST 29 15 - 41 U/L   ALT 25 0 - 44 U/L   Alkaline Phosphatase 89 38 - 126 U/L   Total Bilirubin 0.4 0.3 - 1.2 mg/dL   GFR, Estimated >60 >60 mL/min   Anion gap 11 5 - 15   DG Chest 1 View  Result Date: 09/18/2020 CLINICAL DATA:  Shortness of breath. EXAM: CHEST  1 VIEW COMPARISON:  03/15/2013 FINDINGS: 0442 hours. Low lung volumes. The cardio pericardial silhouette is enlarged. Mild vascular congestion without edema or focal airspace consolidation. No pleural effusion. The visualized bony structures of the thorax show no acute abnormality. IMPRESSION: Low lung volumes without acute cardiopulmonary findings. Electronically Signed   By: Misty Stanley M.D.   On: 09/18/2020 05:13    MDM Physical Exam Labs: CBC, CMP, PC Ratio Measure BPQ15 min EFM Pain Management Assessment and Plan  30 year old, G1P1001  4 Days Postpartum Headache Chills SOB/Wheezing GHTN  -Reviewed POC with patient. -Exam performed and findings discussed.  -Discussed provider concern with extended wheezing.  -Will order CXR for evaluation.  -Reviewed blood pressures. -Will increase dosage from '30mg'$  to '60mg'$  Procardia. -Dose to be given. -Patient without questions.  -Will await results.    Maryann Conners 09/18/2020, 4:04 AM   Reassessment (5:08 AM)  -Patient requesting pain medication. -Provider to bedside, apologies given. -Informed that will give medication for HA complaint. -Reviewed usage of Fioricet and safety during breastfeeding. -Given Fioricet 2 tablets.  -Will await CXR results.  Reassessment (5:46 AM)  -Labs as above. -CXR returns with low lung volumes. -Provider to bedside to discuss findings. -Informed of lab findings not significant for infection or PreEclampsia. -Informed that lungs would benefit from hourly incentive spirometer usage. -Also discussed usage of inhaler as needed.  -Patient reports HA improving, but feels she needs more sleep. -Brief  discussion about importance of sleep as a new mom.  -Encouraged to share responsibility with significant other and take time when she can. -Provider offers patient option to sleep in MAU for a few hours and patient declines.  -Addressed concern regarding chills and informed it could be from being cold or hormonal shifts.  Reassured that no signs of infection identified on results.  -Encouraged to monitor symptoms and report any worsening. -Addressed concern regarding edema.  Encouraged rest, hydration, and elevation.  -Patient instructed to return if any symptoms worsen. -Patient scheduled to follow up with primary ob on Tuesday for blood pressure check. -Discussed postpartum hypertension and how it usually resolves. -Rx for procardia increased from 30 mg to '60mg'$ . -Patient to return to MAU as needed.  -Discharged to home in stable condition.  Maryann Conners MSN, CNM Advanced Practice Provider, [facility]

## 2020-09-18 NOTE — Lactation Note (Signed)
This note was copied from a baby's chart. Lactation Consultation Note  Patient Name: Kathryn Shepherd [id number] Date: 09/18/2020 Reason for consult: Follow-up assessment;Mother's request;Early term 37-38.6wks;Hyperbilirubinemia;Other (Comment) (PIH) Age:31 days  Infant not latched since under bilirubin light therapy. [place] reviewed with Mother different positions supporting her breast under a pillow. Mom large nipple increase with pumping, infant not able to get much depth but will grow into nipple. Slight compression stripe noted following breastfeeding attempt.   Plan 1. To feed based on cues 8-12 in 24 hr period. Mom to work on supporting dense breast on pillow and latching infant with depth as tolerated. Mom to flange out lips, ensure cheeks and nose are touching and compressing breast during feeding.  2. Mom to supplement with EBM via pace bottle feeding with extra slow flow nipple. BF supplementation guide provided. Mom to offer 60 ml or more if infant not able to sustain the latch. 3. Mom to pump with personal, [place], pump q 3hrs for 20 min All questions answered at the end of the visit.   Maternal Data    Feeding Mother's Current Feeding Choice: Breast Milk  LATCH Score                    Lactation Tools Discussed/Used Tools: Pump;Flanges Flange Size: 27 (Mom stated 27 flange was a comfortable fit. Mom to use coconut oil before pumping once at home. Mom aware withpumping flange size can change and adjust accordingly.) Breast pump type: Double-Electric Breast Pump Pump Education: Setup, frequency, and cleaning;Milk Storage Reason for Pumping: increase stimulation Pumping frequency: every 3 hrs on maintenance for 20 min  Interventions Interventions: Breast feeding basics reviewed;Support pillows;Education;Assisted with latch;Position options;Pace feeding;Skin to skin;Expressed milk;Breast massage;Hand express;DEBP;Breast compression;Adjust  position  Discharge Discharge Education: Engorgement and breast care;Warning signs for feeding baby Pump: Personal  Consult Status Consult Status: Complete Date: 09/18/20    Kathryn Shepherd  Kathryn Shepherd 09/18/2020, 12:58 PM

## 2020-09-26 ENCOUNTER — Telehealth (HOSPITAL_COMMUNITY): Payer: Self-pay | Admitting: *Deleted

## 2020-09-26 NOTE — Telephone Encounter (Signed)
Mom reports feeling good. No concerns about herself. EPDS=6 (hospital score = 2). Reports she's already seen a therapist postpartum and she's doing well. Mom reports baby is doing well. She has spoken to peds about a possible tongue-tie issue as latching problem persists. Given lactation info. Pumping breastmilk now. Feeding well, peeing and pooping without difficulty. Sleeps in crib in mom's room on back. No other concerns about baby.  Odis Hollingshead, RN 09-26-2020 at 10:55am

## 2020-09-28 ENCOUNTER — Encounter (HOSPITAL_COMMUNITY): Payer: Self-pay | Admitting: Obstetrics and Gynecology

## 2020-09-28 ENCOUNTER — Inpatient Hospital Stay (HOSPITAL_COMMUNITY)
Admission: AD | Admit: 2020-09-28 | Discharge: 2020-09-29 | Disposition: A | Discharge status: Home / Self Care | Payer: 59 | Attending: Obstetrics and Gynecology | Admitting: Obstetrics and Gynecology

## 2020-09-28 ENCOUNTER — Other Ambulatory Visit: Payer: Self-pay

## 2020-09-28 DIAGNOSIS — Z79899 Other long term (current) drug therapy: Secondary | ICD-10-CM | POA: Diagnosis not present

## 2020-09-28 DIAGNOSIS — I10 Essential (primary) hypertension: Secondary | ICD-10-CM | POA: Insufficient documentation

## 2020-09-28 DIAGNOSIS — R531 Weakness: Secondary | ICD-10-CM | POA: Insufficient documentation

## 2020-09-28 DIAGNOSIS — R5383 Other fatigue: Secondary | ICD-10-CM | POA: Diagnosis not present

## 2020-09-28 DIAGNOSIS — Z8249 Family history of ischemic heart disease and other diseases of the circulatory system: Secondary | ICD-10-CM | POA: Diagnosis not present

## 2020-09-28 DIAGNOSIS — O9089 Other complications of the puerperium, not elsewhere classified: Secondary | ICD-10-CM | POA: Diagnosis not present

## 2020-09-28 DIAGNOSIS — Z886 Allergy status to analgesic agent status: Secondary | ICD-10-CM | POA: Insufficient documentation

## 2020-09-28 DIAGNOSIS — R519 Headache, unspecified: Secondary | ICD-10-CM | POA: Diagnosis not present

## 2020-09-28 DIAGNOSIS — O99893 Other specified diseases and conditions complicating puerperium: Secondary | ICD-10-CM | POA: Diagnosis not present

## 2020-09-28 HISTORY — DX: Essential (primary) hypertension: I10

## 2020-09-28 LAB — CBC
HCT: 40.3 % (ref 36.0–46.0)
Hemoglobin: 13 g/dL (ref 12.0–15.0)
MCH: 25.9 pg — ABNORMAL LOW (ref 26.0–34.0)
MCHC: 32.3 g/dL (ref 30.0–36.0)
MCV: 80.3 fL (ref 80.0–100.0)
Platelets: 325 10*3/uL (ref 150–400)
RBC: 5.02 MIL/uL (ref 3.87–5.11)
RDW: 13 % (ref 11.5–15.5)
WBC: 6.7 10*3/uL (ref 4.0–10.5)
nRBC: 0 % (ref 0.0–0.2)

## 2020-09-28 MED ORDER — ACETAMINOPHEN 500 MG PO TABS
1000.0000 mg | ORAL_TABLET | Freq: Once | ORAL | Status: AC
  Administered 2020-09-29: 1000 mg via ORAL
  Filled 2020-09-28: qty 2

## 2020-09-28 MED ORDER — CYCLOBENZAPRINE HCL 5 MG PO TABS
10.0000 mg | ORAL_TABLET | Freq: Once | ORAL | Status: AC
  Administered 2020-09-29: 10 mg via ORAL
  Filled 2020-09-28: qty 2

## 2020-09-28 NOTE — MAU Provider Note (Signed)
w History     CSN: [id number]  Arrival date and time: 09/28/20 2240   Event Date/Time   First Provider Initiated Contact with Patient 09/28/20 2355      Chief Complaint  Patient presents with   Hypertension   Kathryn Shepherd is a 30 y.o. G1P1001 at 2 Weeks Postpartum who receives care at [facility].  She presents today for Hypertension and HA.  She states she noted a HA at 10am and took tylenol around 1400 without relief.  She states the HA is located in the frontal area and in her right temporal lobes.  She describes it as a pressure sensation that causes a sense of fatigue and weakness.  She states the HA is worsened with bright lights.  She reports she continues to take '60mg'$  of Procardia daily and last dose was at 0700.  However, she questions when she can discontinue usage.  She also reports caffeine intake, but notes today her intake was significantly decreased then usual.  Patient rates her HA a "10, 15, 20" out of 10.   OB History     Gravida  1   Para  1   Term  1   Preterm      AB      Living  1      SAB      IAB      Ectopic      Multiple  0   Live Births  1           Past Medical History:  Diagnosis Date   Hypertension     Past Surgical History:  Procedure Laterality Date   ANTERIOR CRUCIATE LIGAMENT REPAIR      Family History  Problem Relation Age of Onset   Hypertension Mother     Social History   Tobacco Use   Smoking status: Never   Smokeless tobacco: Never  Vaping Use   Vaping Use: Never used  Substance Use Topics   Alcohol use: Never   Drug use: Never    Allergies:  Allergies  Allergen Reactions   Asa [Aspirin]    Ibuprofen    Nickel    Latex Rash and Other (See Comments)    Facial swelling    Medications Prior to Admission  Medication Sig Dispense Refill Last Dose   acetaminophen (TYLENOL) 325 MG tablet Take 2 tablets (650 mg total) by mouth every 6 (six) hours as needed for moderate pain, fever or headache  (for pain scale < 4). 60 tablet 3 09/28/2020 at 1400   NIFEdipine (ADALAT CC) 60 MG 24 hr tablet Take 1 tablet (60 mg total) by mouth daily. 14 tablet 3 09/28/2020 at 0700   Prenatal Vit-Fe Fumarate-FA (MULTIVITAMIN-PRENATAL) 27-0.8 MG TABS tablet Take 1 tablet by mouth daily at 12 noon.   09/28/2020    Review of Systems  Eyes:  Negative for visual disturbance.  Gastrointestinal:  Negative for abdominal pain, nausea and vomiting.  Genitourinary:  Negative for difficulty urinating and dysuria.  Neurological:  Positive for headaches. Negative for dizziness and light-headedness.  Physical Exam   Blood pressure 116/75, pulse 66, temperature 98.2 F (36.8 C), temperature source Oral, resp. rate 17, SpO2 100 %, unknown if currently breastfeeding.  Physical Exam Constitutional:      Appearance: Normal appearance.  HENT:     Head: Normocephalic and atraumatic.  Eyes:     Conjunctiva/sclera: Conjunctivae normal.  Cardiovascular:     Rate and Rhythm: Normal rate and regular  rhythm.     Heart sounds: Normal heart sounds.  Pulmonary:     Effort: Pulmonary effort is normal. No respiratory distress.     Breath sounds: Normal breath sounds.  Abdominal:     General: Bowel sounds are normal.  Musculoskeletal:        General: Normal range of motion.     Cervical back: Normal range of motion.  Skin:    General: Skin is warm and dry.  Neurological:     Mental Status: She is alert and oriented to person, place, and time.  Psychiatric:        Mood and Affect: Mood normal.        Behavior: Behavior normal.        Thought Content: Thought content normal.    MAU Course  Procedures Results for orders placed or performed during the hospital encounter of 09/28/20 (from the past 24 hour(s))  Comprehensive metabolic panel     Status: None   Collection Time: 09/28/20 11:07 PM  Result Value Ref Range   Sodium 137 135 - 145 mmol/L   Potassium 4.1 3.5 - 5.1 mmol/L   Chloride 104 98 - 111 mmol/L   CO2  24 22 - 32 mmol/L   Glucose, Bld 98 70 - 99 mg/dL   BUN 11 6 - 20 mg/dL   Creatinine, Ser 0.86 0.44 - 1.00 mg/dL   Calcium 9.5 8.9 - 10.3 mg/dL   Total Protein 7.2 6.5 - 8.1 g/dL   Albumin 3.6 3.5 - 5.0 g/dL   AST 17 15 - 41 U/L   ALT 16 0 - 44 U/L   Alkaline Phosphatase 91 38 - 126 U/L   Total Bilirubin 0.8 0.3 - 1.2 mg/dL   GFR, Estimated >60 >60 mL/min   Anion gap 9 5 - 15  CBC     Status: Abnormal   Collection Time: 09/28/20 11:07 PM  Result Value Ref Range   WBC 6.7 4.0 - 10.5 K/uL   RBC 5.02 3.87 - 5.11 MIL/uL   Hemoglobin 13.0 12.0 - 15.0 g/dL   HCT 40.3 36.0 - 46.0 %   MCV 80.3 80.0 - 100.0 fL   MCH 25.9 (L) 26.0 - 34.0 pg   MCHC 32.3 30.0 - 36.0 g/dL   RDW 13.0 11.5 - 15.5 %   Platelets 325 150 - 400 K/uL   nRBC 0.0 0.0 - 0.2 %  Urinalysis, Routine w reflex microscopic Urine, Clean Catch     Status: None   Collection Time: 09/28/20 11:48 PM  Result Value Ref Range   Color, Urine YELLOW YELLOW   APPearance CLEAR CLEAR   Specific Gravity, Urine 1.025 1.005 - 1.030   pH 6.0 5.0 - 8.0   Glucose, UA NEGATIVE NEGATIVE mg/dL   Hgb urine dipstick NEGATIVE NEGATIVE   Bilirubin Urine NEGATIVE NEGATIVE   Ketones, ur NEGATIVE NEGATIVE mg/dL   Protein, ur NEGATIVE NEGATIVE mg/dL   Nitrite NEGATIVE NEGATIVE   Leukocytes,Ua NEGATIVE NEGATIVE    MDM Exam Oral Medications Assessment and Plan  30 year old 2 Weeks Postpartum Headache  -POC Reviewed. -Exam Performed. -Discussed factors that can contribute to HA including Procardia and Caffeine withdrawal. -Informed that will start with oral medication and proceed to IV if no improvement. -Will also provide patient with coca cola to provide caffeine. -Discussed gradual decrease of caffeine intake to avoid withdrawal headaches. -Reassured that concern is not for PP PreEclampsia. -Instructed to continue Procardia until discontinued by primary ob provider.  Brief  discussion on how if blood pressures don't improve, after PP  period, would be considered chronic state and managed by PCP. -Labs collected and pending.  -Patient without questions or concerns. -Will monitor and reassess.   Maryann Conners 09/28/2020, 11:55 PM   Reassessment (1:17 AM)  -Patient reports improvement in HA and now 3/10. -Labs return without significant findings.  -Will discharge to home and follow up in office as scheduled.  -Encouraged to call primary office or return to MAU if symptoms worsen or with the onset of new symptoms. -Discharged to home in stable condition.  Maryann Conners MSN, CNM Advanced Practice Provider, [facility]

## 2020-09-28 NOTE — MAU Note (Signed)
Headache since this morning 10 am. Took tylenol once at 2pm. Procardia 60 mg  at 7 am. Hypertension in hospital when had baby via vaginal delivery on August 27th.

## 2020-09-29 DIAGNOSIS — R519 Headache, unspecified: Secondary | ICD-10-CM

## 2020-09-29 DIAGNOSIS — O99893 Other specified diseases and conditions complicating puerperium: Secondary | ICD-10-CM

## 2020-09-29 LAB — COMPREHENSIVE METABOLIC PANEL
ALT: 16 U/L (ref 0–44)
AST: 17 U/L (ref 15–41)
Albumin: 3.6 g/dL (ref 3.5–5.0)
Alkaline Phosphatase: 91 U/L (ref 38–126)
Anion gap: 9 (ref 5–15)
BUN: 11 mg/dL (ref 6–20)
CO2: 24 mmol/L (ref 22–32)
Calcium: 9.5 mg/dL (ref 8.9–10.3)
Chloride: 104 mmol/L (ref 98–111)
Creatinine, Ser: 0.86 mg/dL (ref 0.44–1.00)
GFR, Estimated: >60 mL/min (ref >60)
Glucose, Bld: 98 mg/dL (ref 70–99)
Potassium: 4.1 mmol/L (ref 3.5–5.1)
Sodium: 137 mmol/L (ref 135–145)
Total Bilirubin: 0.8 mg/dL (ref 0.3–1.2)
Total Protein: 7.2 g/dL (ref 6.5–8.1)

## 2020-09-29 LAB — URINALYSIS, ROUTINE W REFLEX MICROSCOPIC
Bilirubin Urine: NEGATIVE
Glucose, UA: NEGATIVE mg/dL
Hgb urine dipstick: NEGATIVE
Ketones, ur: NEGATIVE mg/dL
Leukocytes,Ua: NEGATIVE
Nitrite: NEGATIVE
Protein, ur: NEGATIVE mg/dL
Specific Gravity, Urine: 1.025 (ref 1.005–1.030)
pH: 6 (ref 5.0–8.0)

## 2022-09-02 ENCOUNTER — Other Ambulatory Visit (HOSPITAL_COMMUNITY): Payer: Self-pay | Admitting: Family Medicine

## 2022-09-02 DIAGNOSIS — R011 Cardiac murmur, unspecified: Secondary | ICD-10-CM

## 2022-09-22 ENCOUNTER — Ambulatory Visit (HOSPITAL_COMMUNITY): Payer: 59 | Attending: Family Medicine

## 2022-09-22 DIAGNOSIS — R011 Cardiac murmur, unspecified: Secondary | ICD-10-CM | POA: Diagnosis present

## 2022-09-22 LAB — ECHOCARDIOGRAM COMPLETE
Area-P 1/2: 3.06 cm2
S' Lateral: 2.7 cm

## 2024-01-13 ENCOUNTER — Encounter (HOSPITAL_COMMUNITY): Payer: Self-pay

## 2024-01-13 ENCOUNTER — Emergency Department (HOSPITAL_COMMUNITY)
Admission: EM | Admit: 2024-01-13 | Discharge: 2024-01-13 | Disposition: A | Discharge status: Home / Self Care | Attending: Emergency Medicine | Admitting: Emergency Medicine

## 2024-01-13 ENCOUNTER — Other Ambulatory Visit: Payer: Self-pay

## 2024-01-13 ENCOUNTER — Emergency Department (HOSPITAL_COMMUNITY)

## 2024-01-13 DIAGNOSIS — J101 Influenza due to other identified influenza virus with other respiratory manifestations: Secondary | ICD-10-CM | POA: Diagnosis not present

## 2024-01-13 DIAGNOSIS — I10 Essential (primary) hypertension: Secondary | ICD-10-CM | POA: Insufficient documentation

## 2024-01-13 DIAGNOSIS — R059 Cough, unspecified: Secondary | ICD-10-CM | POA: Diagnosis present

## 2024-01-13 LAB — RESP PANEL BY RT-PCR (RSV, FLU A&B, COVID)  RVPGX2
Influenza A by PCR: POSITIVE — AB
Influenza B by PCR: NEGATIVE
Resp Syncytial Virus by PCR: NEGATIVE
SARS Coronavirus 2 by RT PCR: NEGATIVE

## 2024-01-13 NOTE — ED Triage Notes (Signed)
 Pt POV d/t cough, chills SOB, congestion and CP when breathing and sore throat.  Onset Monday.  Pt last took Sudaphed at 1600 yesterday.

## 2024-01-13 NOTE — Discharge Instructions (Signed)
 You were evaluated in the Emergency Department and after careful evaluation, we did not find any emergent condition requiring admission or further testing in the hospital.  Your exam/testing today is overall reassuring.  You tested positive for the flu.  Your EKG and chest x-ray were reassuring.  Recommend Tylenol  or Motrin as needed for discomfort or fever at home.  Plenty fluids and rest.  Please return to the Emergency Department if you experience any worsening of your condition.   Thank you for allowing us  to be a part of your care.

## 2024-01-13 NOTE — ED Provider Notes (Signed)
 " [facility]-EMER[facility]ENCY DEPT [facility] Emergency Department Provider Note MRN:  [id number]  Arrival date & time: 01/13/2024     Chief Complaint   Cough   History of Present Illness   Kathryn Shepherd is a 33 y.o. year-old female with a history of hypertension presenting to the ED with chief complaint of cough.  Cough, chills, subjective fever, malaise, fatigue, body aches for the past 3 to 4 days.  Having some mild dyspnea as well as chest discomfort described as an itchy feeling in her chest.  Review of Systems  A thorough review of systems was obtained and all systems are negative except as noted in the HPI and PMH.   Patient's Health History    Past Medical History:  Diagnosis Date   Hypertension     Past Surgical History:  Procedure Laterality Date   ANTERIOR CRUCIATE LIGAMENT REPAIR      Family History  Problem Relation Age of Onset   Hypertension Mother     Social History   Socioeconomic History   Marital status: Single    Spouse name: Not on file   Number of children: Not on file   Years of education: Not on file   Highest education level: Not on file  Occupational History   Not on file  Tobacco Use   Smoking status: Never   Smokeless tobacco: Never  Vaping Use   Vaping status: Never Used  Substance and Sexual Activity   Alcohol use: Never   Drug use: Never   Sexual activity: Not Currently  Other Topics Concern   Not on file  Social History Narrative   Not on file   Social Drivers of Health   Tobacco Use: Low Risk (01/13/2024)   Patient History    Smoking Tobacco Use: Never    Smokeless Tobacco Use: Never    Passive Exposure: Not on file  Financial Resource Strain: Not on file  Food Insecurity: Not on file  Transportation Needs: Not on file  Physical Activity: Not on file  Stress: Not on file  Social Connections: Not on file  Intimate Partner Violence: Not on file  Depression (EYV7-0): Not on file  Alcohol Screen: Not on file  Housing:  Not on file  Utilities: Not on file  Health Literacy: Not on file     Physical Exam   Vitals:   01/13/24 0124 01/13/24 0315  BP: (!) 176/101 (!) 149/97  Pulse: (!) 116 (!) 110  Resp: 18 (!) 41  Temp: 99.4 F (37.4 C)   SpO2: 100% 100%    CONSTITUTIONAL: Well-appearing, NAD NEURO/PSYCH:  Alert and oriented x 3, no focal deficits EYES:  eyes equal and reactive ENT/NECK:  no LAD, no JVD CARDIO: Regular rate, well-perfused, normal S1 and S2 PULM:  CTAB no wheezing or rhonchi GI/GU:  non-distended, non-tender MSK/SPINE:  No gross deformities, no edema SKIN:  no rash, atraumatic   *Additional and/or pertinent findings included in MDM below  Diagnostic and Interventional Summary    EKG Interpretation Date/Time:    Ventricular Rate:    PR Interval:    QRS Duration:    QT Interval:    QTC Calculation:   R Axis:      Text Interpretation:         Labs Reviewed  RESP PANEL BY RT-PCR (RSV, FLU A&B, COVID)  RVPGX2 - Abnormal; Notable for the following components:      Result Value   Influenza A by PCR POSITIVE (*)  All other components within normal limits  CULTURE, GROUP A STREP [facility])    DG Chest 2 View  Final Result      Medications - No data to display   Procedures  /  Critical Care Procedures  ED Course and Medical Decision Making  Initial Impression and Ddx Symptoms are most suggestive of a viral illness, possibly the flu.  On my exam she is resting comfortably, wakes easily, no increased work of breathing, lungs are clear, no meningismus.  Respiratory rate is normal, there are some documented respiratory rates in the 40s but I feel these are inaccurate.  With the chest discomfort and shortness of breath will obtain screening EKG and chest x-ray to ensure no obvious pneumonia or pneumothorax or signs of cardiac ischemia or right heart strain.  She does not have risk factors for PE, highly doubt this is the cause of her symptoms.  Past medical/surgical  history that increases complexity of ED encounter: None  Interpretation of Diagnostics I personally reviewed the EKG and my interpretation is as follows: Sinus tachycardia  Chest x-ray unremarkable.  Flu positive  Patient Reassessment and Ultimate Disposition/Management     Patient is out of the window for any benefit from Tamiflu.  She is safe for discharge home with return precautions.  Patient management required discussion with the following services or consulting groups:  None  Complexity of Problems Addressed Acute complicated illness or Injury  Additional Data Reviewed and Analyzed Further history obtained from: Prior labs/imaging results  Additional Factors Impacting ED Encounter Risk None  Ozell HERO. Theadore, MD [facility] Emergency Medicine [facility] [email or web address]  Final Clinical Impressions(s) / ED Diagnoses     ICD-10-CM   1. Influenza A  J10.1       ED Discharge Orders     None        Discharge Instructions Discussed with and Provided to Patient:    Discharge Instructions      You were evaluated in the Emergency Department and after careful evaluation, we did not find any emergent condition requiring admission or further testing in the hospital.  Your exam/testing today is overall reassuring.  You tested positive for the flu.  Your EKG and chest x-ray were reassuring.  Recommend Tylenol  or Motrin as needed for discomfort or fever at home.  Plenty fluids and rest.  Please return to the Emergency Department if you experience any worsening of your condition.   Thank you for allowing us  to be a part of your care.      Theadore Ozell HERO, MD 01/13/24 [phone]  "

## 2024-01-15 LAB — CULTURE, GROUP A STREP (THRC)
